# Patient Record
Sex: Male | Born: 1995 | Race: Black or African American | Hispanic: No | Marital: Single | Smoking: Never smoker
Health system: Southern US, Community
[De-identification: ages and names within clinical notes are randomized; demographics above are authoritative.]

## PROBLEM LIST (undated history)

## (undated) ENCOUNTER — Ambulatory Visit (HOSPITAL_COMMUNITY): Admission: EM

---

## 2017-08-21 ENCOUNTER — Encounter (HOSPITAL_COMMUNITY): Payer: Self-pay

## 2017-08-21 ENCOUNTER — Ambulatory Visit (HOSPITAL_COMMUNITY)
Admission: EM | Admit: 2017-08-21 | Discharge: 2017-08-21 | Disposition: A | Discharge status: Home / Self Care | Payer: Self-pay | Attending: Family Medicine | Admitting: Family Medicine

## 2017-08-21 ENCOUNTER — Other Ambulatory Visit: Payer: Self-pay

## 2017-08-21 DIAGNOSIS — J020 Streptococcal pharyngitis: Secondary | ICD-10-CM

## 2017-08-21 LAB — POCT RAPID STREP A: STREPTOCOCCUS, GROUP A SCREEN (DIRECT): POSITIVE — AB

## 2017-08-21 MED ORDER — ONDANSETRON HCL 4 MG PO TABS: 4.0000 mg | ORAL_TABLET | Freq: Three times a day (TID) | ORAL | 0 refills | Status: DC | PRN

## 2017-08-21 MED ORDER — AMOXICILLIN 500 MG PO CAPS: 500.0000 mg | ORAL_CAPSULE | Freq: Two times a day (BID) | ORAL | 0 refills | Status: AC

## 2017-08-21 NOTE — ED Provider Notes (Signed)
[facility]    CSN: [id number] Arrival date & time: 08/21/17  1425     History   Chief Complaint Chief Complaint  Patient presents with  . Sore Throat    HPI Angel Wheeler is a 22 y.o. male.   Angel Wheeler presents with complaints of sore throat, headache, fevers, and vomiting which started 7/28. States he went to a hospital in [place] on 7/30, had a temp of 102, but left without being seen. Was given tylenol at that time which helped with his fever, has been taking it every 4 hours ever since which helps with his fevers. Last at 1p today. Still with sore throat and headache. Drinking fluids but has not been eating. Emesis on 7/29 so has not been eating since. States had two watery bowel movements today. No blood. No abdominal pain. No rash. No known ill contacts. Normal urination. Without contributing medical history.     ROS per HPI.      History reviewed. No pertinent past medical history.  There are no active problems to display for this patient.   History reviewed. No pertinent surgical history.     Home Medications    Prior to Admission medications   Medication Sig Start Date End Date Taking? Authorizing Provider  amoxicillin (AMOXIL) 500 MG capsule Take 1 capsule (500 mg total) by mouth 2 (two) times daily for 10 days. 08/21/17 08/31/17  Georgetta HaberBurky, Damico Partin B, NP  ondansetron (ZOFRAN) 4 MG tablet Take 1 tablet (4 mg total) by mouth every 8 (eight) hours as needed for nausea or vomiting. 08/21/17   Georgetta HaberBurky, Caysen Whang B, NP    Family History No family history on file.  Social History Social History   Tobacco Use  . Smoking status: Never Smoker  . Smokeless tobacco: Never Used  Substance Use Topics  . Alcohol use: Not Currently  . Drug use: Not Currently     Allergies   Patient has no allergy information on record.   Review of Systems Review of Systems   Physical Exam Triage Vital Signs ED Triage Vitals  Enc Vitals Group     BP 08/21/17 1449  125/85     Pulse Rate 08/21/17 1449 83     Resp 08/21/17 1449 16     Temp 08/21/17 1449 97.9 F (36.6 C)     Temp Source 08/21/17 1449 Oral     SpO2 08/21/17 1449 98 %     Weight 08/21/17 1450 187 lb (84.8 kg)     Height --      Head Circumference --      Peak Flow --      Pain Score 08/21/17 1450 6     Pain Loc --      Pain Edu? --      Excl. in GC? --    No data found.  Updated Vital Signs BP 125/85   Pulse 83   Temp 97.9 F (36.6 C) (Oral)   Resp 16   Wt 187 lb (84.8 kg)   SpO2 98%    Physical Exam  Constitutional: He is oriented to person, place, and time. He appears well-developed and well-nourished.  HENT:  Head: Normocephalic and atraumatic.  Right Ear: Tympanic membrane, external ear and ear canal normal.  Left Ear: Tympanic membrane, external ear and ear canal normal.  Nose: Nose normal. Right sinus exhibits no maxillary sinus tenderness and no frontal sinus tenderness. Left sinus exhibits no maxillary sinus tenderness and no frontal sinus tenderness.  Mouth/Throat: Uvula is midline, oropharynx is clear and moist and mucous membranes are normal. No oral lesions. No uvula swelling. No tonsillar abscesses. Tonsils are 2+ on the right. Tonsils are 2+ on the left. No tonsillar exudate.  Eyes: Pupils are equal, round, and reactive to light. Conjunctivae are normal.  Neck: Normal range of motion.  Cardiovascular: Normal rate and regular rhythm.  Pulmonary/Chest: Effort normal and breath sounds normal.  Abdominal: Soft. There is no tenderness.  Lymphadenopathy:    He has cervical adenopathy.  Neurological: He is alert and oriented to person, place, and time.  Skin: Skin is warm and dry.  Vitals reviewed.    UC Treatments / Results  Labs (all labs ordered are listed, but only abnormal results are displayed) Labs Reviewed  POCT RAPID STREP A - Abnormal; Notable for the following components:      Result Value   Streptococcus, Group A Screen (Direct) POSITIVE (*)     All other components within normal limits    EKG None  Radiology No results found.  Procedures Procedures (including critical care time)  Medications Ordered in UC Medications - No data to display  Initial Impression / Assessment and Plan / UC Course  I have reviewed the triage vital signs and the nursing notes.  Pertinent labs & imaging results that were available during my care of the patient were reviewed by me and considered in my medical decision making (see chart for details).     Positive rapid strep. Course of amoxicillin provided. zofran provided. Encouraged fluid intake. Return precautions provided. If symptoms worsen or do not improve in the next week to return to be seen or to follow up with PCP.  Patient verbalized understanding and agreeable to plan.    Final Clinical Impressions(s) / UC Diagnoses   Final diagnoses:  Strep pharyngitis     Discharge Instructions     Complete course of antibiotics.   Push fluids to ensure adequate hydration and keep secretions thin.   Tylenol and/or ibuprofen as needed for pain or fevers.   Zofran as needed for nausea or vomiting.  Considered contagious for 24 hours after antibiotics started.  If symptoms worsen or do not improve in the next week to return to be seen or to follow up with your PCP.      ED Prescriptions    Medication Sig Dispense Auth. Provider   amoxicillin (AMOXIL) 500 MG capsule Take 1 capsule (500 mg total) by mouth 2 (two) times daily for 10 days. 20 capsule Tavius Turgeon B, NP   ondansetron (ZOFRAN) 4 MG tablet Take 1 tablet (4 mg total) by mouth every 8 (eight) hours as needed for nausea or vomiting. 10 tablet Georgetta Haber, NP     Controlled Substance Prescriptions [place] Controlled Substance Registry consulted? Not Applicable   Georgetta Haber, NP 08/21/17 1520

## 2017-08-21 NOTE — ED Triage Notes (Signed)
Sore throat and headache 4 days

## 2017-08-21 NOTE — Discharge Instructions (Addendum)
Complete course of antibiotics.   Push fluids to ensure adequate hydration and keep secretions thin.   Tylenol and/or ibuprofen as needed for pain or fevers.   Zofran as needed for nausea or vomiting.  Considered contagious for 24 hours after antibiotics started.  If symptoms worsen or do not improve in the next week to return to be seen or to follow up with your PCP.

## 2018-05-12 ENCOUNTER — Ambulatory Visit (HOSPITAL_COMMUNITY)
Admission: EM | Admit: 2018-05-12 | Discharge: 2018-05-12 | Disposition: A | Discharge status: Home / Self Care | Payer: Self-pay | Attending: Family Medicine | Admitting: Family Medicine

## 2018-05-12 ENCOUNTER — Encounter (HOSPITAL_COMMUNITY): Payer: Self-pay | Admitting: Emergency Medicine

## 2018-05-12 ENCOUNTER — Other Ambulatory Visit: Payer: Self-pay

## 2018-05-12 DIAGNOSIS — J029 Acute pharyngitis, unspecified: Secondary | ICD-10-CM

## 2018-05-12 DIAGNOSIS — J069 Acute upper respiratory infection, unspecified: Secondary | ICD-10-CM | POA: Insufficient documentation

## 2018-05-12 LAB — POCT RAPID STREP A: Streptococcus, Group A Screen (Direct): NEGATIVE

## 2018-05-12 NOTE — ED Triage Notes (Addendum)
Left ear pain, sore throat, cough, productive cough symptoms started yesterday  Denies fever.

## 2018-05-12 NOTE — ED Provider Notes (Addendum)
[facility]   [id number] 05/12/18 Arrival Time: 1525  ASSESSMENT & PLAN:  1. Sore throat   2. Viral upper respiratory tract infection    See AVS for discharge instructions.  Rapid strep negative. Culture sent. OTC symptom care as needed. Ensure adequate fluid intake and rest. May f/u with PCP or here as needed.  Work note provided.  Reviewed expectations re: course of current medical issues. Questions answered. Outlined signs and symptoms indicating need for more acute intervention. Patient verbalized understanding. After Visit Summary given.   SUBJECTIVE: History from: patient.  Ka-Twan Ma'Leak Chasse is a 23 y.o. male who presents with complaint of nasal congestion, post-nasal drainage, and a mild dry cough; with sore throat. Onset abrupt, yesterday; without fatigue and without body aches. SOB: none. Wheezing: none. Fever: none reported. Overall normal PO intake without n/v. Known sick contacts: no. No specific or significant aggravating or alleviating factors reported. OTC treatment: none reported.  Social History   Tobacco Use  Smoking Status Never Smoker  Smokeless Tobacco Never Used    ROS: As per HPI. All other systems negative.   OBJECTIVE:  Vitals:   05/12/18 1606  BP: 128/87  Pulse: 82  Resp: 18  Temp: 98.7 F (37.1 C)  TempSrc: Oral  SpO2: 97%     General appearance: alert; appears fatigued HEENT: nasal congestion; clear runny nose; throat irritation secondary to post-nasal drainage Neck: supple without LAD CV: RRR Lungs: unlabored respirations, symmetrical air entry without wheezing; cough: absent Abd: soft Ext: no LE edema Skin: warm and dry Psychological: alert and cooperative; normal mood and affect  No Known Allergies  No PMH of recurrent strep throat.  Social History   Socioeconomic History  . Marital status: Single    Spouse name: Not on file  . Number of children: Not on file  . Years of education: Not on file  .  Highest education level: Not on file  Occupational History  . Not on file  Social Needs  . Financial resource strain: Not on file  . Food insecurity:    Worry: Not on file    Inability: Not on file  . Transportation needs:    Medical: Not on file    Non-medical: Not on file  Tobacco Use  . Smoking status: Never Smoker  . Smokeless tobacco: Never Used  Substance and Sexual Activity  . Alcohol use: Not Currently  . Drug use: Not Currently  . Sexual activity: Yes  Lifestyle  . Physical activity:    Days per week: Not on file    Minutes per session: Not on file  . Stress: Not on file  Relationships  . Social connections:    Talks on phone: Not on file    Gets together: Not on file    Attends religious service: Not on file    Active member of club or organization: Not on file    Attends meetings of clubs or organizations: Not on file    Relationship status: Not on file  . Intimate partner violence:    Fear of current or ex partner: Not on file    Emotionally abused: Not on file    Physically abused: Not on file    Forced sexual activity: Not on file  Other Topics Concern  . Not on file  Social History Narrative  . Not on file           Mardella Layman, MD 05/13/18 1118    Mardella Layman, MD 05/13/18 [phone]

## 2018-05-12 NOTE — Discharge Instructions (Signed)
You may use over the counter ibuprofen or acetaminophen as needed.  For a sore throat, over the counter products such as [employer] Peroxyl Mouth Sore Rinse or Chloraseptic Sore Throat Spray may provide some temporary relief. Your rapid strep test was negative today. We have sent your throat swab for culture and will let you know of any positive results. 

## 2018-05-14 LAB — CULTURE, GROUP A STREP (THRC)

## 2018-06-18 ENCOUNTER — Encounter (HOSPITAL_COMMUNITY): Payer: Self-pay

## 2018-06-18 ENCOUNTER — Other Ambulatory Visit: Payer: Self-pay

## 2018-06-18 ENCOUNTER — Ambulatory Visit (HOSPITAL_COMMUNITY)
Admission: EM | Admit: 2018-06-18 | Discharge: 2018-06-18 | Disposition: A | Discharge status: Home / Self Care | Payer: BLUE CROSS/BLUE SHIELD | Attending: Internal Medicine | Admitting: Internal Medicine

## 2018-06-18 DIAGNOSIS — R51 Headache: Secondary | ICD-10-CM

## 2018-06-18 DIAGNOSIS — R519 Headache, unspecified: Secondary | ICD-10-CM

## 2018-06-18 NOTE — ED Provider Notes (Signed)
[facility]    CSN: [id number] Arrival date & time: 06/18/18  1536     History   Chief Complaint Chief Complaint  Patient presents with  . Headache    HPI Ka-Twan Amery Jara is a 23 y.o. male no past medical history comes to urgent care for return to work note.  Patient apparently had some headaches which has subsided.  Patient wanted to go back to work but his employer asked him to come to be evaluated prior to coming back to work.  Patient denies any fever, chills, shortness of breath or cough.  No sick contacts. HPI  History reviewed. No pertinent past medical history.  There are no active problems to display for this patient.   History reviewed. No pertinent surgical history.     Home Medications    Prior to Admission medications   Medication Sig Start Date End Date Taking? Authorizing Provider  ondansetron (ZOFRAN) 4 MG tablet Take 1 tablet (4 mg total) by mouth every 8 (eight) hours as needed for nausea or vomiting. 08/21/17   Georgetta Haber, NP    Family History Family History  Problem Relation Age of Onset  . Hypertension Father     Social History Social History   Tobacco Use  . Smoking status: Never Smoker  . Smokeless tobacco: Never Used  Substance Use Topics  . Alcohol use: Yes  . Drug use: Not Currently     Allergies   Patient has no known allergies.   Review of Systems Review of Systems  Constitutional: Negative for activity change and appetite change.  Neurological: Negative for dizziness, light-headedness, numbness and headaches.     Physical Exam Triage Vital Signs ED Triage Vitals [06/18/18 1601]  Enc Vitals Group     BP (!) 141/79     Pulse Rate 73     Resp 18     Temp 98.6 F (37 C)     Temp Source Oral     SpO2 97 %     Weight 201 lb (91.2 kg)     Height      Head Circumference      Peak Flow      Pain Score 8     Pain Loc      Pain Edu?      Excl. in GC?    No data found.  Updated Vital Signs  BP (!) 141/79   Pulse 73   Temp 98.6 F (37 C) (Oral)   Resp 18   Wt 91.2 kg   SpO2 97%   Visual Acuity Right Eye Distance:   Left Eye Distance:   Bilateral Distance:    Right Eye Near:   Left Eye Near:    Bilateral Near:     Physical Exam Constitutional:      General: He is not in acute distress.    Appearance: He is well-developed. He is not ill-appearing.  Eyes:     General: No visual field deficit. Pulmonary:     Effort: Pulmonary effort is normal. No respiratory distress.     Breath sounds: Normal breath sounds. No wheezing or rales.  Abdominal:     General: Bowel sounds are normal.     Palpations: Abdomen is soft.  Neurological:     Mental Status: He is alert and oriented to person, place, and time.     GCS: GCS eye subscore is 4. GCS verbal subscore is 5. GCS motor subscore is 6.  Cranial Nerves: No cranial nerve deficit, dysarthria or facial asymmetry.      UC Treatments / Results  Labs (all labs ordered are listed, but only abnormal results are displayed) Labs Reviewed - No data to display  EKG None  Radiology No results found.  Procedures Procedures (including critical care time)  Medications Ordered in UC Medications - No data to display  Initial Impression / Assessment and Plan / UC Course  I have reviewed the triage vital signs and the nursing notes.  Pertinent labs & imaging results that were available during my care of the patient were reviewed by me and considered in my medical decision making (see chart for details).     1.  Headache, resolved: Return to work note given to patient. Final Clinical Impressions(s) / UC Diagnoses   Final diagnoses:  Headache disorder   Discharge Instructions   None    ED Prescriptions    None     Controlled Substance Prescriptions [place] Controlled Substance Registry consulted? No   Merrilee JanskyLamptey, Bona Hubbard O, MD 06/18/18 2006

## 2018-06-18 NOTE — ED Triage Notes (Signed)
Pt states he has a headache x 2 days. Pt needs a work note.

## 2018-08-09 ENCOUNTER — Other Ambulatory Visit: Payer: Self-pay

## 2018-08-09 ENCOUNTER — Ambulatory Visit (HOSPITAL_COMMUNITY)
Admission: EM | Admit: 2018-08-09 | Discharge: 2018-08-09 | Disposition: A | Discharge status: Home / Self Care | Payer: BLUE CROSS/BLUE SHIELD | Attending: Urgent Care | Admitting: Urgent Care

## 2018-08-09 ENCOUNTER — Encounter (HOSPITAL_COMMUNITY): Payer: Self-pay | Admitting: Urgent Care

## 2018-08-09 DIAGNOSIS — R5381 Other malaise: Secondary | ICD-10-CM

## 2018-08-09 DIAGNOSIS — J029 Acute pharyngitis, unspecified: Secondary | ICD-10-CM

## 2018-08-09 LAB — POCT RAPID STREP A: Streptococcus, Group A Screen (Direct): NEGATIVE

## 2018-08-09 MED ORDER — CLINDAMYCIN HCL 300 MG PO CAPS: 300.0000 mg | ORAL_CAPSULE | Freq: Three times a day (TID) | ORAL | 0 refills | Status: DC

## 2018-08-09 MED ORDER — NAPROXEN 500 MG PO TABS: 500.0000 mg | ORAL_TABLET | Freq: Two times a day (BID) | ORAL | 0 refills | Status: DC

## 2018-08-09 NOTE — ED Triage Notes (Signed)
Pt sts sore throat x 3 days  

## 2018-08-09 NOTE — Discharge Instructions (Signed)
For sore throat or cough try using a honey-based tea. Use 3 teaspoons of honey with juice squeezed from half lemon. Place shaved pieces of ginger into 1/2-1 cup of water and warm over stove top. Then mix the ingredients and repeat every 4 hours as needed. Please take naproxen 500 mg every 12 hours with food for your throat pain and inflammation.  Hydrate very well with at least 2 liters of water. Eat light meals such as soups to replenish electrolytes and soft fruits, veggies. Start an antihistamine like Zyrtec, Allegra or Claritin for postnasal drainage, sinus congestion.  You can take this together with pseudoephedrine (Sudafed) at a dose of 60 mg 3 times a day oral 120 mg twice daily as needed for the same kind of congestion.  However, do not take Sudafed if you have high blood pressure or are prone to palpitations, have abnormal heart rhythms.

## 2018-08-09 NOTE — ED Provider Notes (Signed)
MRN: [id number] DOB: Dec 08, 1995  Subjective:   Angel Wheeler is a 23 y.o. male presenting for 3 day history of acute onset worsening moderate-severe throat pain with difficulty swallowing and speaking. Has tried ibuprofen with some relief. He is not currently taking any medications and has no known food or drug allergies.  Denies past medical and surgical history. Denies smoking cigarettes. No COVID contacts.    Review of Systems  Constitutional: Positive for fever (subjective) and malaise/fatigue.  HENT: Positive for ear pain and sore throat. Negative for congestion and sinus pain.   Eyes: Negative for blurred vision, double vision, discharge and redness.  Respiratory: Negative for cough, hemoptysis, shortness of breath and wheezing.   Cardiovascular: Negative for chest pain.  Gastrointestinal: Positive for vomiting (1 episode last night). Negative for abdominal pain, diarrhea and nausea.  Genitourinary: Negative for dysuria, flank pain and hematuria.  Musculoskeletal: Negative for myalgias.  Skin: Negative for rash.  Neurological: Negative for dizziness, weakness and headaches.  Psychiatric/Behavioral: Negative for depression and substance abuse.    Objective:   Vitals: BP 130/86 (BP Location: Right Arm)   Pulse 72   Temp 98.6 F (37 C) (Oral)   Resp 16   SpO2 99%   Physical Exam Constitutional:      General: He is not in acute distress.    Appearance: Normal appearance. He is well-developed and normal weight. He is not ill-appearing, toxic-appearing or diaphoretic.  HENT:     Head: Normocephalic and atraumatic.     Right Ear: Tympanic membrane, ear canal and external ear normal. There is no impacted cerumen.     Left Ear: Tympanic membrane, ear canal and external ear normal. There is no impacted cerumen.     Nose: Nose normal. No congestion or rhinorrhea.     Mouth/Throat:     Mouth: Mucous membranes are moist.     Pharynx: Pharyngeal swelling (2+ with deviation  medially) and posterior oropharyngeal erythema present. No oropharyngeal exudate.     Comments: Patient has muffled voice and difficulty speaking. Eyes:     General: No scleral icterus.       Right eye: No discharge.        Left eye: No discharge.     Extraocular Movements: Extraocular movements intact.     Conjunctiva/sclera: Conjunctivae normal.     Pupils: Pupils are equal, round, and reactive to light.  Neck:     Musculoskeletal: Normal range of motion and neck supple. No neck rigidity or muscular tenderness.  Cardiovascular:     Rate and Rhythm: Normal rate and regular rhythm.     Heart sounds: Normal heart sounds. No murmur. No friction rub. No gallop.   Pulmonary:     Effort: Pulmonary effort is normal. No respiratory distress.     Breath sounds: Normal breath sounds. No stridor. No wheezing, rhonchi or rales.  Neurological:     General: No focal deficit present.     Mental Status: He is alert and oriented to person, place, and time.  Psychiatric:        Mood and Affect: Mood normal.        Behavior: Behavior normal.        Thought Content: Thought content normal.    Results for orders placed or performed during the hospital encounter of 08/09/18 (from the past 24 hour(s))  POCT rapid strep A [facility])     Status: None   Collection Time: 08/09/18 11:02 AM  Result Value Ref Range  Streptococcus, Group A Screen (Direct) NEGATIVE NEGATIVE    Assessment and Plan :   1. Acute pharyngitis, unspecified etiology   2. Sore throat   3. Malaise     Patient has concerning physical exam findings and despite negative strep will cover for pharyngitis with clindamycin.  Counseled patient if he starts to have difficulty with his breathing that he needs to report to the emergency room.  Also if he continues to have symptoms despite our treatment will have patient report to the emergency room for retropharyngeal abscess. Counseled patient on potential for adverse effects with  medications prescribed/recommended today, ER and return-to-clinic precautions discussed, patient verbalized understanding.    Wallis BambergMani, Raylynn Hersh, PA-C 08/09/18 1106

## 2018-08-11 LAB — CULTURE, GROUP A STREP (THRC)

## 2018-08-19 ENCOUNTER — Encounter (HOSPITAL_COMMUNITY): Payer: Self-pay | Admitting: Emergency Medicine

## 2018-08-19 ENCOUNTER — Other Ambulatory Visit: Payer: Self-pay

## 2018-08-19 ENCOUNTER — Ambulatory Visit (HOSPITAL_COMMUNITY)
Admission: EM | Admit: 2018-08-19 | Discharge: 2018-08-19 | Disposition: A | Discharge status: Home / Self Care | Payer: Self-pay | Attending: Emergency Medicine | Admitting: Emergency Medicine

## 2018-08-19 DIAGNOSIS — Z7251 High risk heterosexual behavior: Secondary | ICD-10-CM

## 2018-08-19 DIAGNOSIS — R300 Dysuria: Secondary | ICD-10-CM

## 2018-08-19 DIAGNOSIS — Z202 Contact with and (suspected) exposure to infections with a predominantly sexual mode of transmission: Secondary | ICD-10-CM

## 2018-08-19 LAB — POCT URINALYSIS DIP (DEVICE)
Bilirubin Urine: NEGATIVE
Glucose, UA: NEGATIVE mg/dL
Ketones, ur: NEGATIVE mg/dL
Leukocytes,Ua: NEGATIVE
Nitrite: NEGATIVE
Protein, ur: NEGATIVE mg/dL
Specific Gravity, Urine: >=1.03 (ref 1.005–1.030)
Urobilinogen, UA: 0.2 mg/dL (ref 0.0–1.0)
pH: 6 (ref 5.0–8.0)

## 2018-08-19 MED ORDER — AZITHROMYCIN 250 MG PO TABS
ORAL_TABLET | ORAL | Status: AC
  Filled 2018-08-19: qty 4

## 2018-08-19 MED ORDER — METRONIDAZOLE 500 MG PO TABS: 500.0000 mg | ORAL_TABLET | Freq: Two times a day (BID) | ORAL | 0 refills | Status: DC

## 2018-08-19 MED ORDER — AZITHROMYCIN 250 MG PO TABS
1000.0000 mg | ORAL_TABLET | Freq: Once | ORAL | Status: AC
  Administered 2018-08-19: 1000 mg via ORAL

## 2018-08-19 MED ORDER — CEFTRIAXONE SODIUM 250 MG IJ SOLR
INTRAMUSCULAR | Status: AC
  Filled 2018-08-19: qty 250

## 2018-08-19 MED ORDER — CEFTRIAXONE SODIUM 250 MG IJ SOLR
250.0000 mg | Freq: Once | INTRAMUSCULAR | Status: AC
  Administered 2018-08-19: 250 mg via INTRAMUSCULAR

## 2018-08-19 NOTE — ED Triage Notes (Signed)
Dysuria for 3 weeks, requests STD testing

## 2018-08-19 NOTE — ED Provider Notes (Signed)
  MRN: [id number] DOB: February 23, 1995  Subjective:   Angel Wheeler is a 23 y.o. male presenting for STI testing.  Patient reports that he has had at least 2 weeks of painful urination.  Has multiple male sex partners and does not always use condoms for protection.  He states that his girlfriend recently got tested and was positive for what he thinks is chlamydia and trichomonas.  No current medications.  No Known Allergies  No past medical history or past surgical history.  ROS Denies hematuria, urinary frequency, penile discharge, penile swelling, testicular pain, testicular swelling, anal pain, groin pain.   Objective:   Vitals: BP 113/75 (BP Location: Left Arm)   Pulse (!) 58   Temp 97.8 F (36.6 C) (Temporal)   Resp 16   SpO2 98%   Physical Exam Constitutional:      Appearance: Normal appearance. He is well-developed and normal weight.  HENT:     Head: Normocephalic and atraumatic.     Right Ear: External ear normal.     Left Ear: External ear normal.     Nose: Nose normal.     Mouth/Throat:     Pharynx: Oropharynx is clear.  Eyes:     Extraocular Movements: Extraocular movements intact.     Pupils: Pupils are equal, round, and reactive to light.  Cardiovascular:     Rate and Rhythm: Normal rate.  Pulmonary:     Effort: Pulmonary effort is normal.  Neurological:     Mental Status: He is alert and oriented to person, place, and time.  Psychiatric:        Mood and Affect: Mood normal.        Behavior: Behavior normal.      Assessment and Plan :   1. Dysuria   2. Exposure to STD   3. Exposure to chlamydia   4. Exposure to trichomonas     We will treat empirically per CDC guidelines for gonorrhea and chlamydia with IM ceftriaxone and azithromycin in clinic.  Given exposure to trichomonas will also cover for this with Flagyl.  Counseled on safe sex practices including abstaining for 1 week following treatment.  Counseled patient on potential for adverse  effects with medications prescribed/recommended today, ER and return-to-clinic precautions discussed, patient verbalized understanding.    Jaynee Eagles, [place] 08/19/18 1133

## 2018-08-20 LAB — URINE CYTOLOGY ANCILLARY ONLY
Chlamydia: NEGATIVE
Neisseria Gonorrhea: NEGATIVE
Trichomonas: NEGATIVE

## 2018-09-24 ENCOUNTER — Encounter (HOSPITAL_COMMUNITY): Payer: Self-pay

## 2018-09-24 ENCOUNTER — Other Ambulatory Visit: Payer: Self-pay

## 2018-09-24 ENCOUNTER — Ambulatory Visit (HOSPITAL_COMMUNITY)
Admission: EM | Admit: 2018-09-24 | Discharge: 2018-09-24 | Disposition: A | Discharge status: Home / Self Care | Payer: Self-pay | Attending: Internal Medicine | Admitting: Internal Medicine

## 2018-09-24 DIAGNOSIS — J029 Acute pharyngitis, unspecified: Secondary | ICD-10-CM | POA: Insufficient documentation

## 2018-09-24 DIAGNOSIS — Z20828 Contact with and (suspected) exposure to other viral communicable diseases: Secondary | ICD-10-CM | POA: Insufficient documentation

## 2018-09-24 LAB — POCT RAPID STREP A: Streptococcus, Group A Screen (Direct): NEGATIVE

## 2018-09-24 MED ORDER — ACETAMINOPHEN 325 MG PO TABS: 650.0000 mg | ORAL_TABLET | Freq: Once | ORAL | Status: DC

## 2018-09-24 MED ORDER — IBUPROFEN 400 MG PO TABS: 400.0000 mg | ORAL_TABLET | Freq: Four times a day (QID) | ORAL | 0 refills | Status: DC | PRN

## 2018-09-24 MED ORDER — ACETAMINOPHEN 325 MG PO TABS
ORAL_TABLET | ORAL | Status: AC
  Filled 2018-09-24: qty 2

## 2018-09-24 MED ORDER — CEPACOL SORE THROAT 10-2.1 MG MT LOZG: 1.0000 | LOZENGE | OROMUCOSAL | 0 refills | Status: DC | PRN

## 2018-09-24 NOTE — ED Provider Notes (Signed)
[facility]    CSN: [id number] Arrival date & time: 09/24/18  [id number]      History   Chief Complaint No chief complaint on file.   HPI Angel Wheeler is a 23 y.o. male with no past medical history comes to urgent care with complaint of sore throat, fever, chills and headache of 2 days duration.  Symptom onset was abrupt.  Sore throat is severe.  Associated with difficulty swallowing.  No known relieving factors.  Patient has been trying some Advil at home with no improvement.  No nausea/vomiting or diarrhea.  No rash in the skin.  Patient denies any sick contacts.  He has some body aches which is minimal.  No shortness of breath, cough or sputum production.Marland Kitchen.   HPI  No past medical history on file.  There are no active problems to display for this patient.   No past surgical history on file.     Home Medications    Prior to Admission medications   Medication Sig Start Date End Date Taking? Authorizing Provider  clindamycin (CLEOCIN) 300 MG capsule Take 1 capsule (300 mg total) by mouth 3 (three) times daily. 08/09/18   Wallis BambergMani, Mario, PA-C  metroNIDAZOLE (FLAGYL) 500 MG tablet Take 1 tablet (500 mg total) by mouth 2 (two) times daily with a meal. DO NOT CONSUME ALCOHOL WHILE TAKING THIS MEDICATION. 08/19/18   Wallis BambergMani, Mario, PA-C  naproxen (NAPROSYN) 500 MG tablet Take 1 tablet (500 mg total) by mouth 2 (two) times daily. 08/09/18   Wallis BambergMani, Mario, PA-C    Family History Family History  Problem Relation Age of Onset  . Hypertension Father     Social History Social History   Tobacco Use  . Smoking status: Never Smoker  . Smokeless tobacco: Never Used  Substance Use Topics  . Alcohol use: Yes  . Drug use: Not Currently     Allergies   Patient has no known allergies.   Review of Systems Review of Systems  Constitutional: Positive for activity change, chills, fatigue and fever.  HENT: Positive for congestion and sore throat. Negative for ear pain, hearing  loss and rhinorrhea.   Respiratory: Negative.   Gastrointestinal: Negative.   Genitourinary: Negative.   Neurological: Negative for dizziness, weakness and light-headedness.  Psychiatric/Behavioral: Negative.      Physical Exam Triage Vital Signs ED Triage Vitals  Enc Vitals Group     BP      Pulse      Resp      Temp      Temp src      SpO2      Weight      Height      Head Circumference      Peak Flow      Pain Score      Pain Loc      Pain Edu?      Excl. in [place]?    No data found.  Updated Vital Signs There were no vitals taken for this visit.  Visual Acuity Right Eye Distance:   Left Eye Distance:   Bilateral Distance:    Right Eye Near:   Left Eye Near:    Bilateral Near:     Physical Exam Constitutional:      Appearance: He is ill-appearing. He is not toxic-appearing.  HENT:     Right Ear: Tympanic membrane normal. No drainage or swelling.     Left Ear: Tympanic membrane normal. No drainage or swelling.  Mouth/Throat:     Mouth: Mucous membranes are moist.     Tonsils: Tonsillar exudate present. 1+ on the right. 1+ on the left.  Eyes:     Conjunctiva/sclera: Conjunctivae normal.  Neck:     Musculoskeletal: Normal range of motion.  Cardiovascular:     Rate and Rhythm: Normal rate and regular rhythm.  Pulmonary:     Effort: Pulmonary effort is normal.  Abdominal:     Palpations: Abdomen is soft.  Skin:    General: Skin is warm.     Capillary Refill: Capillary refill takes less than 2 seconds.  Neurological:     Mental Status: He is alert.      UC Treatments / Results  Labs (all labs ordered are listed, but only abnormal results are displayed) Labs Reviewed - No data to display  EKG   Radiology No results found.  Procedures Procedures (including critical care time)  Medications Ordered in UC Medications - No data to display  Initial Impression / Assessment and Plan / UC Course  I have reviewed the triage vital signs and the  nursing notes.  Pertinent labs & imaging results that were available during my care of the patient were reviewed by me and considered in my medical decision making (see chart for details).     1.  Flulike illness: Strep throat is negative Throat cultures have been sent COVID-19 test has been sent Patient is advised to self quarantine until the COVID-19 test is available. If patient's condition gets worse he is advised to return to urgent care to be reevaluated. Final Clinical Impressions(s) / UC Diagnoses   Final diagnoses:  None   Discharge Instructions   None    ED Prescriptions    None     Controlled Substance Prescriptions [place] Controlled Substance Registry consulted? No   Chase Picket, MD 09/24/18 1159

## 2018-09-24 NOTE — ED Triage Notes (Signed)
Pt states he has a sore throat and vomiting. X 2 days

## 2018-09-26 LAB — CULTURE, GROUP A STREP (THRC)

## 2018-09-26 LAB — NOVEL CORONAVIRUS, NAA (HOSP ORDER, SEND-OUT TO REF LAB; TAT 18-24 HRS): SARS-CoV-2, NAA: NOT DETECTED

## 2019-01-27 ENCOUNTER — Ambulatory Visit (HOSPITAL_COMMUNITY): Admission: EM | Admit: 2019-01-27 | Discharge: 2019-01-27 | Discharge status: Against Medical Advice | Payer: Self-pay

## 2019-01-28 ENCOUNTER — Other Ambulatory Visit: Payer: Self-pay

## 2019-01-28 ENCOUNTER — Ambulatory Visit (HOSPITAL_COMMUNITY)
Admission: EM | Admit: 2019-01-28 | Discharge: 2019-01-28 | Disposition: A | Discharge status: Home / Self Care | Payer: Self-pay | Attending: Family Medicine | Admitting: Family Medicine

## 2019-01-28 ENCOUNTER — Encounter (HOSPITAL_COMMUNITY): Payer: Self-pay

## 2019-01-28 DIAGNOSIS — R369 Urethral discharge, unspecified: Secondary | ICD-10-CM

## 2019-01-28 MED ORDER — CEFTRIAXONE SODIUM 250 MG IJ SOLR
INTRAMUSCULAR | Status: AC
  Filled 2019-01-28: qty 500

## 2019-01-28 MED ORDER — LIDOCAINE HCL (PF) 1 % IJ SOLN
INTRAMUSCULAR | Status: AC
Start: 2019-01-28 — End: ?
  Filled 2019-01-28: qty 2

## 2019-01-28 MED ORDER — CEFTRIAXONE SODIUM 1 G IJ SOLR
0.5000 g | Freq: Once | INTRAMUSCULAR | Status: AC
  Administered 2019-01-28: 10:00:00 0.5 g via INTRAMUSCULAR

## 2019-01-28 MED ORDER — DOXYCYCLINE HYCLATE 100 MG PO CAPS: 100.0000 mg | ORAL_CAPSULE | Freq: Two times a day (BID) | ORAL | 0 refills | Status: AC

## 2019-01-28 NOTE — ED Triage Notes (Signed)
Pt states he has penile discharge. Pt states he needs to be treated for STD's. Pt states he has had this discharge for a week.

## 2019-01-28 NOTE — ED Provider Notes (Signed)
[facility]    CSN: [id number] Arrival date & time: 01/28/19  [id number]      History   Chief Complaint Chief Complaint  Patient presents with  . SEXUALLY TRANSMITTED DISEASE  . Penile Discharge    HPI Angel Wheeler is a 24 y.o. male.   Patient is a 24 year old male who presents today with approximately 1 week of dysuria and penile discharge.  Symptoms have been constant.  Currently sexually active with 1 or more partners unprotected.  Denies any rashes, penile pain, testicle pain or swelling.  Denies any abdominal pain, fever, chills.  ROS per HPI      History reviewed. No pertinent past medical history.  There are no problems to display for this patient.   History reviewed. No pertinent surgical history.     Home Medications    Prior to Admission medications   Medication Sig Start Date End Date Taking? Authorizing Provider  Benzocaine-Menthol (CEPACOL SORE THROAT) 10-2.1 MG LOZG Use as directed 1 lozenge in the mouth or throat as needed. 09/24/18   Lamptey, Myrene Galas, MD  doxycycline (VIBRAMYCIN) 100 MG capsule Take 1 capsule (100 mg total) by mouth 2 (two) times daily for 7 days. 01/28/19 02/04/19  Loura Halt A, NP  ibuprofen (ADVIL) 400 MG tablet Take 1 tablet (400 mg total) by mouth every 6 (six) hours as needed. 09/24/18   LampteyMyrene Galas, MD    Family History Family History  Problem Relation Age of Onset  . Hypertension Father     Social History Social History   Tobacco Use  . Smoking status: Never Smoker  . Smokeless tobacco: Never Used  Substance Use Topics  . Alcohol use: Yes  . Drug use: Not Currently     Allergies   Patient has no known allergies.   Review of Systems Review of Systems   Physical Exam Triage Vital Signs ED Triage Vitals [01/28/19 0847]  Enc Vitals Group     BP 118/83     Pulse Rate 66     Resp 18     Temp 98 F (36.7 C)     Temp Source Oral     SpO2 98 %     Weight 200 lb (90.7 kg)     Height     Head Circumference      Peak Flow      Pain Score 5     Pain Loc      Pain Edu?      Excl. in [place]?    No data found.  Updated Vital Signs BP 118/83 (BP Location: Right Arm)   Pulse 66   Temp 98 F (36.7 C) (Oral)   Resp 18   Wt 200 lb (90.7 kg)   SpO2 98%   Visual Acuity Right Eye Distance:   Left Eye Distance:   Bilateral Distance:    Right Eye Near:   Left Eye Near:    Bilateral Near:     Physical Exam Vitals and nursing note reviewed.  Constitutional:      Appearance: Normal appearance.  HENT:     Head: Normocephalic and atraumatic.     Nose: Nose normal.  Eyes:     Conjunctiva/sclera: Conjunctivae normal.  Pulmonary:     Effort: Pulmonary effort is normal.  Abdominal:     Palpations: Abdomen is soft.     Tenderness: There is no abdominal tenderness.  Genitourinary:    Testes: Normal.     Comments: White discharge  from meatus Musculoskeletal:        General: Normal range of motion.     Cervical back: Normal range of motion.  Skin:    General: Skin is warm and dry.  Neurological:     Mental Status: He is alert.  Psychiatric:        Mood and Affect: Mood normal.      UC Treatments / Results  Labs (all labs ordered are listed, but only abnormal results are displayed) Labs Reviewed  CYTOLOGY, (ORAL, ANAL, URETHRAL) ANCILLARY ONLY    EKG   Radiology No results found.  Procedures Procedures (including critical care time)  Medications Ordered in UC Medications  cefTRIAXone (ROCEPHIN) injection 0.5 g (0.5 g Intramuscular Given 01/28/19 0931)    Initial Impression / Assessment and Plan / UC Course  I have reviewed the triage vital signs and the nursing notes.  Pertinent labs & imaging results that were available during my care of the patient were reviewed by me and considered in my medical decision making (see chart for details).     Penile discharge-treating with 500 Rocephin here in clinic and sending home with doxycycline Recommended  safe sex practices Follow up as needed for continued or worsening symptoms  Final Clinical Impressions(s) / UC Diagnoses   Final diagnoses:  Penile discharge     Discharge Instructions     Treating you for gonorrhea and chlamydia. Injection given here in clinic and prescription sent to the pharmacy to take for the next week. Make sure you finish the complete prescription     ED Prescriptions    Medication Sig Dispense Auth. Provider   doxycycline (VIBRAMYCIN) 100 MG capsule Take 1 capsule (100 mg total) by mouth 2 (two) times daily for 7 days. 14 capsule Kalika Smay A, NP     PDMP not reviewed this encounter.   Dahlia Byes A, NP 01/28/19 1411

## 2019-01-28 NOTE — Discharge Instructions (Addendum)
Treating you for gonorrhea and chlamydia. Injection given here in clinic and prescription sent to the pharmacy to take for the next week. Make sure you finish the complete prescription

## 2019-01-29 LAB — CYTOLOGY, (ORAL, ANAL, URETHRAL) ANCILLARY ONLY
Chlamydia: NEGATIVE
Neisseria Gonorrhea: POSITIVE — AB
Trichomonas: NEGATIVE

## 2019-01-30 ENCOUNTER — Telehealth (HOSPITAL_COMMUNITY): Payer: Self-pay | Admitting: Emergency Medicine

## 2019-01-30 NOTE — Telephone Encounter (Signed)
Test for gonorrhea was positive. This was treated at the urgent care visit with IM rocephin 500mg . Please refrain from sexual intercourse for 7 days after treatment to give the medicine time to work. Sexual partners need to be notified and tested/treated. Condoms may reduce risk of reinfection. Recheck or followup with PCP for further evaluation if symptoms are not improving. [facility] notified.   Patient contacted by phone and made aware of    results. Pt verbalized understanding and had all questions answered.

## 2019-11-27 ENCOUNTER — Encounter (HOSPITAL_COMMUNITY): Payer: Self-pay

## 2019-11-27 ENCOUNTER — Emergency Department (HOSPITAL_COMMUNITY): Payer: Self-pay

## 2019-11-27 ENCOUNTER — Emergency Department (HOSPITAL_COMMUNITY)
Admission: EM | Admit: 2019-11-27 | Discharge: 2019-11-28 | Disposition: A | Discharge status: Home / Self Care | Payer: Self-pay | Attending: Emergency Medicine | Admitting: Emergency Medicine

## 2019-11-27 DIAGNOSIS — R52 Pain, unspecified: Secondary | ICD-10-CM

## 2019-11-27 DIAGNOSIS — Z5321 Procedure and treatment not carried out due to patient leaving prior to being seen by health care provider: Secondary | ICD-10-CM | POA: Insufficient documentation

## 2019-11-27 DIAGNOSIS — R109 Unspecified abdominal pain: Secondary | ICD-10-CM | POA: Insufficient documentation

## 2019-11-27 DIAGNOSIS — R519 Headache, unspecified: Secondary | ICD-10-CM | POA: Diagnosis present

## 2019-11-27 DIAGNOSIS — Y92410 Unspecified street and highway as the place of occurrence of the external cause: Secondary | ICD-10-CM | POA: Insufficient documentation

## 2019-11-27 DIAGNOSIS — M25552 Pain in left hip: Secondary | ICD-10-CM | POA: Insufficient documentation

## 2019-11-27 DIAGNOSIS — M542 Cervicalgia: Secondary | ICD-10-CM | POA: Insufficient documentation
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
## 2019-11-27 NOTE — ED Notes (Signed)
Pt yelling in lobby cursing about the wait. Staff asked pt multiple times to stop cursing, but pt refused. Staff informed pt that he can step outside if he feels the need to continue. Pt decided to leave.

## 2019-11-28 ENCOUNTER — Emergency Department (HOSPITAL_COMMUNITY)
Admission: EM | Admit: 2019-11-28 | Discharge: 2019-11-28 | Disposition: A | Discharge status: Home / Self Care | Payer: Self-pay | Source: Home / Self Care | Attending: Emergency Medicine | Admitting: Emergency Medicine

## 2019-11-28 ENCOUNTER — Encounter (HOSPITAL_COMMUNITY): Payer: Self-pay | Admitting: Emergency Medicine

## 2019-11-28 ENCOUNTER — Other Ambulatory Visit: Payer: Self-pay

## 2019-11-28 ENCOUNTER — Emergency Department (HOSPITAL_COMMUNITY): Payer: Self-pay

## 2019-11-28 ENCOUNTER — Ambulatory Visit (HOSPITAL_COMMUNITY)
Admission: EM | Admit: 2019-11-28 | Discharge: 2019-11-28 | Disposition: A | Discharge status: Other Acute Inpatient Hospital | Payer: Self-pay

## 2019-11-28 LAB — CBC WITH DIFFERENTIAL/PLATELET
Abs Immature Granulocytes: 0.02 10*3/uL (ref 0.00–0.07)
Basophils Absolute: 0 10*3/uL (ref 0.0–0.1)
Basophils Relative: 0 %
Eosinophils Absolute: 0 10*3/uL (ref 0.0–0.5)
Eosinophils Relative: 0 %
HCT: 53.1 % — ABNORMAL HIGH (ref 39.0–52.0)
Hemoglobin: 17.7 g/dL — ABNORMAL HIGH (ref 13.0–17.0)
Immature Granulocytes: 0 %
Lymphocytes Relative: 22 %
Lymphs Abs: 1.6 10*3/uL (ref 0.7–4.0)
MCH: 31.2 pg (ref 26.0–34.0)
MCHC: 33.3 g/dL (ref 30.0–36.0)
MCV: 93.5 fL (ref 80.0–100.0)
Monocytes Absolute: 0.5 10*3/uL (ref 0.1–1.0)
Monocytes Relative: 7 %
Neutro Abs: 5.3 10*3/uL (ref 1.7–7.7)
Neutrophils Relative %: 71 %
Platelets: 272 10*3/uL (ref 150–400)
RBC: 5.68 MIL/uL (ref 4.22–5.81)
RDW: 13.2 % (ref 11.5–15.5)
WBC: 7.5 10*3/uL (ref 4.0–10.5)
nRBC: 0 % (ref 0.0–0.2)

## 2019-11-28 LAB — URINALYSIS, ROUTINE W REFLEX MICROSCOPIC
Bilirubin Urine: NEGATIVE
Glucose, UA: NEGATIVE mg/dL
Hgb urine dipstick: NEGATIVE
Ketones, ur: NEGATIVE mg/dL
Leukocytes,Ua: NEGATIVE
Nitrite: NEGATIVE
Protein, ur: NEGATIVE mg/dL
Specific Gravity, Urine: 1.021 (ref 1.005–1.030)
pH: 6 (ref 5.0–8.0)

## 2019-11-28 LAB — COMPREHENSIVE METABOLIC PANEL
ALT: 27 U/L (ref 0–44)
AST: 33 U/L (ref 15–41)
Albumin: 4.3 g/dL (ref 3.5–5.0)
Alkaline Phosphatase: 52 U/L (ref 38–126)
Anion gap: 10 (ref 5–15)
BUN: 13 mg/dL (ref 6–20)
CO2: 26 mmol/L (ref 22–32)
Calcium: 10 mg/dL (ref 8.9–10.3)
Chloride: 99 mmol/L (ref 98–111)
Creatinine, Ser: 1.25 mg/dL — ABNORMAL HIGH (ref 0.61–1.24)
GFR, Estimated: >60 mL/min (ref >60)
Glucose, Bld: 92 mg/dL (ref 70–99)
Potassium: 3.9 mmol/L (ref 3.5–5.1)
Sodium: 135 mmol/L (ref 135–145)
Total Bilirubin: 0.9 mg/dL (ref 0.3–1.2)
Total Protein: 8.1 g/dL (ref 6.5–8.1)

## 2019-11-28 LAB — LIPASE, BLOOD: Lipase: 28 U/L (ref 11–51)

## 2019-11-28 MED ORDER — MORPHINE SULFATE (PF) 4 MG/ML IV SOLN
8.0000 mg | Freq: Once | INTRAVENOUS | Status: AC
  Administered 2019-11-28: 8 mg via INTRAVENOUS
  Filled 2019-11-28: qty 2

## 2019-11-28 MED ORDER — ONDANSETRON HCL 4 MG/2ML IJ SOLN
4.0000 mg | Freq: Once | INTRAMUSCULAR | Status: AC
  Administered 2019-11-28: 4 mg via INTRAVENOUS
  Filled 2019-11-28: qty 2

## 2019-11-28 MED ORDER — DICLOFENAC SODIUM 1 % EX GEL: 4.0000 g | Freq: Four times a day (QID) | CUTANEOUS | 0 refills | Status: DC

## 2019-11-28 MED ORDER — IOHEXOL 300 MG/ML  SOLN
100.0000 mL | Freq: Once | INTRAMUSCULAR | Status: AC | PRN
  Administered 2019-11-28: 100 mL via INTRAVENOUS

## 2019-11-28 NOTE — ED Notes (Signed)
Patient transported to CT 
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
## 2019-11-28 NOTE — ED Notes (Signed)
Transported to CT 

## 2019-11-28 NOTE — Discharge Instructions (Signed)
Take 4 over the counter ibuprofen tablets 3 times a day or 2 over-the-counter naproxen tablets twice a day for pain. Also take tylenol 1000mg (2 extra strength) four times a day.    Luckily all of your imaging was negative for acute intra-abdominal or bony injury.  Please follow-up with your family doctor.  Return to the ED for worsening pain.
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
## 2020-04-18 ENCOUNTER — Ambulatory Visit (HOSPITAL_COMMUNITY): Admission: EM | Admit: 2020-04-18 | Discharge: 2020-04-18 | Discharge status: Against Medical Advice | Payer: Self-pay

## 2020-04-18 ENCOUNTER — Other Ambulatory Visit: Payer: Self-pay

## 2020-08-04 ENCOUNTER — Ambulatory Visit (INDEPENDENT_AMBULATORY_CARE_PROVIDER_SITE_OTHER): Payer: Self-pay

## 2020-08-04 ENCOUNTER — Other Ambulatory Visit: Payer: Self-pay

## 2020-08-04 ENCOUNTER — Ambulatory Visit (HOSPITAL_COMMUNITY)
Admission: EM | Admit: 2020-08-04 | Discharge: 2020-08-04 | Disposition: A | Discharge status: Home / Self Care | Payer: Self-pay | Attending: Medical Oncology | Admitting: Medical Oncology

## 2020-08-04 ENCOUNTER — Encounter (HOSPITAL_COMMUNITY): Payer: Self-pay | Admitting: Emergency Medicine

## 2020-08-04 DIAGNOSIS — R059 Cough, unspecified: Secondary | ICD-10-CM

## 2020-08-04 DIAGNOSIS — J029 Acute pharyngitis, unspecified: Secondary | ICD-10-CM | POA: Insufficient documentation

## 2020-08-04 DIAGNOSIS — R042 Hemoptysis: Secondary | ICD-10-CM

## 2020-08-04 LAB — POCT INFECTIOUS MONO SCREEN, ED / UC: Mono Screen: NEGATIVE

## 2020-08-04 LAB — POCT RAPID STREP A, ED / UC: Streptococcus, Group A Screen (Direct): NEGATIVE

## 2020-08-04 MED ORDER — OMEPRAZOLE 20 MG PO CPDR: 20.0000 mg | DELAYED_RELEASE_CAPSULE | Freq: Every day | ORAL | 0 refills | Status: DC

## 2020-08-04 MED ORDER — BENZONATATE 100 MG PO CAPS: 100.0000 mg | ORAL_CAPSULE | Freq: Three times a day (TID) | ORAL | 0 refills | Status: DC

## 2020-08-04 NOTE — ED Triage Notes (Signed)
Coughing up yellow and blood tinged sputum. Denies fever, weight loss, night sweats.

## 2020-08-04 NOTE — ED Provider Notes (Signed)
[facility]    CSN: [id number] Arrival date & time: 08/04/20  4818      History   Chief Complaint Chief Complaint  Patient presents with   Cough    HPI Angel Wheeler is a 25 y.o. male.   HPI  Cough: Patient states that he has had a cough for about 1 month.  He states that he is coughing up yellow and red sputum.  He states that cough is worse at night when he sleeps and first thing in the morning.  He has not tried anything for cough.  He denies any unintentional weight loss, night sweats, known interactions with individuals with active TB, high risk TB exposures.  He denies any chest pain, shortness of breath or wheezing.  No known fevers.  He does also report a fairly significant sore throat that he has had for about a month.  Sore throat rated ache like an 7 out of 10 in nature.  He reports that he was formally a vape pen user however he has not smoked in about a month.   History reviewed. No pertinent past medical history.  There are no problems to display for this patient.   History reviewed. No pertinent surgical history.     Home Medications    Prior to Admission medications   Medication Sig Start Date End Date Taking? Authorizing Provider  benzonatate (TESSALON) 100 MG capsule Take 1 capsule (100 mg total) by mouth every 8 (eight) hours. 08/04/20  Yes Karlin Heilman M, PA-C  omeprazole (PRILOSEC) 20 MG capsule Take 1 capsule (20 mg total) by mouth daily. 08/04/20  Yes Wileen Duncanson M, PA-C  Benzocaine-Menthol (CEPACOL SORE THROAT) 10-2.1 MG LOZG Use as directed 1 lozenge in the mouth or throat as needed. Patient not taking: Reported on 11/28/2019 09/24/18   Merrilee Jansky, MD  diclofenac Sodium (VOLTAREN) 1 % GEL Apply 4 g topically 4 (four) times daily. 11/28/19   Melene Plan, DO  ibuprofen (ADVIL) 400 MG tablet Take 1 tablet (400 mg total) by mouth every 6 (six) hours as needed. Patient not taking: Reported on 11/28/2019 09/24/18   Merrilee Jansky, MD    Family History Family History  Problem Relation Age of Onset   Hypertension Father     Social History Social History   Tobacco Use   Smoking status: Never   Smokeless tobacco: Never  Substance Use Topics   Alcohol use: Yes   Drug use: Not Currently     Allergies   Patient has no known allergies.   Review of Systems Review of Systems  As stated above in HPI Physical Exam Triage Vital Signs ED Triage Vitals  Enc Vitals Group     BP 08/04/20 0852 131/85     Pulse Rate 08/04/20 0852 62     Resp 08/04/20 0852 14     Temp 08/04/20 0852 98 F (36.7 C)     Temp Source 08/04/20 0852 Oral     SpO2 08/04/20 0852 96 %     Weight --      Height --      Head Circumference --      Peak Flow --      Pain Score 08/04/20 0855 7     Pain Loc --      Pain Edu? --      Excl. in GC? --    No data found.  Updated Vital Signs BP 131/85 (BP Location: Right Arm)  Pulse 62   Temp 98 F (36.7 C) (Oral)   Resp 14   SpO2 96%   Physical Exam Vitals and nursing note reviewed.  Constitutional:      General: He is not in acute distress.    Appearance: Normal appearance. He is not ill-appearing, toxic-appearing or diaphoretic.     Comments: No hoarseness or hot potato voice  HENT:     Head: Normocephalic and atraumatic.     Right Ear: Tympanic membrane normal.     Left Ear: Tympanic membrane normal.     Nose: Nose normal. No congestion or rhinorrhea.     Mouth/Throat:     Mouth: Mucous membranes are moist.     Pharynx: Oropharynx is clear. Posterior oropharyngeal erythema present. No oropharyngeal exudate.  Eyes:     Extraocular Movements: Extraocular movements intact.     Pupils: Pupils are equal, round, and reactive to light.  Cardiovascular:     Rate and Rhythm: Normal rate and regular rhythm.     Heart sounds: Normal heart sounds.  Pulmonary:     Effort: Pulmonary effort is normal. No respiratory distress.     Breath sounds: Normal breath sounds. No  stridor. No wheezing, rhonchi or rales.  Chest:     Chest wall: No tenderness.  Musculoskeletal:     Cervical back: Normal range of motion and neck supple.  Lymphadenopathy:     Cervical: Cervical adenopathy present.  Skin:    General: Skin is warm.  Neurological:     Mental Status: He is alert and oriented to person, place, and time.     UC Treatments / Results  Labs (all labs ordered are listed, but only abnormal results are displayed) Labs Reviewed  CULTURE, GROUP A STREP [facility])  POCT RAPID STREP A, ED / UC  POCT INFECTIOUS MONO SCREEN, ED / UC  CYTOLOGY, (ORAL, ANAL, URETHRAL) ANCILLARY ONLY    EKG   Radiology DG Chest 2 View  Result Date: 08/04/2020 CLINICAL DATA:  Cough for 1 month.  Hemoptysis. EXAM: CHEST - 2 VIEW COMPARISON:  PA and lateral chest 11/27/2019. FINDINGS: Lungs clear. Heart size normal. No pneumothorax or pleural fluid. No bony abnormality. IMPRESSION: Normal chest. Electronically Signed   By: Drusilla Kanner M.D.   On: 08/04/2020 10:03    Procedures Procedures (including critical care time)  Medications Ordered in UC Medications - No data to display  Initial Impression / Assessment and Plan / UC Course  I have reviewed the triage vital signs and the nursing notes.  Pertinent labs & imaging results that were available during my care of the patient were reviewed by me and considered in my medical decision making (see chart for details).     New.  Wide differential.  His x-ray is normal.  His rapid strep and mononucleosis testing are both negative in office today.  Cytology as well as a strep culture have been sent out to the lab.  For now we are going to trial Tessalon and omeprazole to see if this helps with his symptoms.  If not I have recommended that he see a pulmonologist.  Discussed red flag signs and symptoms. Final Clinical Impressions(s) / UC Diagnoses   Final diagnoses:  Cough  Hemoptysis  Sore throat   Discharge Instructions    None    ED Prescriptions     Medication Sig Dispense Auth. Provider   benzonatate (TESSALON) 100 MG capsule Take 1 capsule (100 mg total) by mouth every 8 (eight) hours.  21 capsule Trevonne Nyland M, PA-C   omeprazole (PRILOSEC) 20 MG capsule Take 1 capsule (20 mg total) by mouth daily. 14 capsule Rushie Chestnut, [place]      PDMP not reviewed this encounter.   Rushie Chestnut, [place] 08/04/20 1101

## 2020-08-06 LAB — CULTURE, GROUP A STREP (THRC)

## 2020-08-07 LAB — CYTOLOGY, (ORAL, ANAL, URETHRAL) ANCILLARY ONLY
Chlamydia: NEGATIVE
Comment: NEGATIVE
Comment: NEGATIVE
Comment: NORMAL
Neisseria Gonorrhea: NEGATIVE
Trichomonas: NEGATIVE

## 2020-09-19 ENCOUNTER — Encounter (HOSPITAL_COMMUNITY): Payer: Self-pay | Admitting: *Deleted

## 2020-09-19 ENCOUNTER — Other Ambulatory Visit: Payer: Self-pay

## 2020-09-19 ENCOUNTER — Ambulatory Visit (HOSPITAL_COMMUNITY)
Admission: EM | Admit: 2020-09-19 | Discharge: 2020-09-19 | Disposition: A | Discharge status: Home / Self Care | Payer: BC Managed Care – PPO | Attending: Physician Assistant | Admitting: Physician Assistant

## 2020-09-19 DIAGNOSIS — B370 Candidal stomatitis: Secondary | ICD-10-CM | POA: Diagnosis not present

## 2020-09-19 MED ORDER — NYSTATIN 100000 UNIT/ML MT SUSP: 500000.0000 [IU] | Freq: Four times a day (QID) | OROMUCOSAL | 0 refills | Status: DC

## 2020-09-19 NOTE — ED Provider Notes (Signed)
[facility]    CSN: [id number] Arrival date & time: 09/19/20  0805      History   Chief Complaint Chief Complaint  Patient presents with   Sore Throat    HPI Angel Wheeler is a 25 y.o. male.   Patient with no significant PMH, here c/w sore throat x 3 - 4 months.  He was seen here 6 weeks ago for same, Strep, GC/CT of throat negative.  He was provided trail of omeprazole and benzonatate for his cough to see if that would improve throat, which he did not take because pharmacist informed him it's not for sore throat.  He reports his throat was "very white" yesterday.  Denies allergy like sx.     History reviewed. No pertinent past medical history.  There are no problems to display for this patient.   History reviewed. No pertinent surgical history.     Home Medications    Prior to Admission medications   Medication Sig Start Date End Date Taking? Authorizing Provider  nystatin (MYCOSTATIN) 100000 UNIT/ML suspension Take 5 mLs (500,000 Units total) by mouth 4 (four) times daily. Swish, gargle, and swallow 09/19/20  Yes Evern Core, PA-C  Benzocaine-Menthol (CEPACOL SORE THROAT) 10-2.1 MG LOZG Use as directed 1 lozenge in the mouth or throat as needed. Patient not taking: Reported on 11/28/2019 09/24/18   Merrilee Jansky, MD  benzonatate (TESSALON) 100 MG capsule Take 1 capsule (100 mg total) by mouth every 8 (eight) hours. 08/04/20   Rushie Chestnut, PA-C  diclofenac Sodium (VOLTAREN) 1 % GEL Apply 4 g topically 4 (four) times daily. 11/28/19   Melene Plan, DO  ibuprofen (ADVIL) 400 MG tablet Take 1 tablet (400 mg total) by mouth every 6 (six) hours as needed. Patient not taking: Reported on 11/28/2019 09/24/18   Merrilee Jansky, MD  omeprazole (PRILOSEC) 20 MG capsule Take 1 capsule (20 mg total) by mouth daily. 08/04/20   Rushie Chestnut, PA-C    Family History Family History  Problem Relation Age of Onset   Hypertension Father     Social  History Social History   Tobacco Use   Smoking status: Never   Smokeless tobacco: Never  Substance Use Topics   Alcohol use: Yes   Drug use: Not Currently     Allergies   Patient has no known allergies.   Review of Systems Review of Systems  Constitutional:  Negative for chills, fatigue and fever.  HENT:  Positive for postnasal drip and sore throat. Negative for congestion, ear pain, nosebleeds, rhinorrhea, sinus pressure, sinus pain, sneezing, trouble swallowing and voice change.   Eyes:  Negative for pain, discharge, redness and itching.  Respiratory:  Positive for cough. Negative for shortness of breath and wheezing.   Gastrointestinal:  Negative for abdominal pain, diarrhea, nausea and vomiting.  Musculoskeletal:  Negative for arthralgias and myalgias.  Skin:  Negative for rash.  Neurological:  Negative for light-headedness and headaches.  Hematological:  Negative for adenopathy. Does not bruise/bleed easily.  Psychiatric/Behavioral:  Negative for confusion and sleep disturbance.     Physical Exam Triage Vital Signs ED Triage Vitals  Enc Vitals Group     BP 09/19/20 0829 139/80     Pulse Rate 09/19/20 0829 77     Resp 09/19/20 0829 18     Temp 09/19/20 0829 98.8 F (37.1 C)     Temp src --      SpO2 09/19/20 0829 97 %  Weight --      Height --      Head Circumference --      Peak Flow --      Pain Score 09/19/20 0826 8     Pain Loc --      Pain Edu? --      Excl. in GC? --    No data found.  Updated Vital Signs BP 139/80   Pulse 77   Temp 98.8 F (37.1 C)   Resp 18   SpO2 97%   Visual Acuity Right Eye Distance:   Left Eye Distance:   Bilateral Distance:    Right Eye Near:   Left Eye Near:    Bilateral Near:     Physical Exam Vitals and nursing note reviewed.  Constitutional:      General: He is not in acute distress.    Appearance: Normal appearance. He is not ill-appearing.  HENT:     Head: Normocephalic and atraumatic.     Right  Ear: Tympanic membrane and ear canal normal.     Left Ear: Tympanic membrane and ear canal normal.     Nose: No congestion or rhinorrhea.     Mouth/Throat:     Mouth: No oral lesions.     Pharynx: Uvula midline. Oropharyngeal exudate (white, removable with tongue depressor) present. No posterior oropharyngeal erythema or uvula swelling.     Tonsils: No tonsillar exudate or tonsillar abscesses.  Eyes:     General: No scleral icterus.    Extraocular Movements: Extraocular movements intact.     Conjunctiva/sclera: Conjunctivae normal.  Cardiovascular:     Rate and Rhythm: Normal rate and regular rhythm.     Heart sounds: No murmur heard. Pulmonary:     Effort: Pulmonary effort is normal. No respiratory distress.     Breath sounds: Normal breath sounds. No wheezing or rales.  Musculoskeletal:     Cervical back: Normal range of motion. No rigidity.  Lymphadenopathy:     Cervical: No cervical adenopathy.  Skin:    Coloration: Skin is not jaundiced.     Findings: No rash.  Neurological:     General: No focal deficit present.     Mental Status: He is alert and oriented to person, place, and time.     Motor: No weakness.     Gait: Gait normal.  Psychiatric:        Mood and Affect: Mood normal.        Behavior: Behavior normal.     UC Treatments / Results  Labs (all labs ordered are listed, but only abnormal results are displayed) Labs Reviewed - No data to display  EKG   Radiology No results found.  Procedures Procedures (including critical care time)  Medications Ordered in UC Medications - No data to display  Initial Impression / Assessment and Plan / UC Course  I have reviewed the triage vital signs and the nursing notes.  Pertinent labs & imaging results that were available during my care of the patient were reviewed by me and considered in my medical decision making (see chart for details).     Possible thrush as cause, will trial nystatin Follow up with PCP /  ENT if no improvement Final Clinical Impressions(s) / UC Diagnoses   Final diagnoses:  Thrush   Discharge Instructions   None    ED Prescriptions     Medication Sig Dispense Auth. Provider   nystatin (MYCOSTATIN) 100000 UNIT/ML suspension Take 5 mLs (500,000 Units total)  by mouth 4 (four) times daily. Swish, gargle, and swallow 120 mL Evern Core, PA-C      PDMP not reviewed this encounter.   Evern Core, PA-C 09/19/20 [id number]

## 2020-09-19 NOTE — ED Triage Notes (Signed)
PT reports sore throat for 3-4 months

## 2020-11-15 ENCOUNTER — Other Ambulatory Visit: Payer: Self-pay

## 2020-11-15 ENCOUNTER — Encounter (HOSPITAL_COMMUNITY): Payer: Self-pay

## 2020-11-15 ENCOUNTER — Ambulatory Visit (HOSPITAL_COMMUNITY)
Admission: EM | Admit: 2020-11-15 | Discharge: 2020-11-15 | Disposition: A | Discharge status: Home / Self Care | Payer: BC Managed Care – PPO | Attending: Family Medicine | Admitting: Family Medicine

## 2020-11-15 DIAGNOSIS — R590 Localized enlarged lymph nodes: Secondary | ICD-10-CM | POA: Diagnosis not present

## 2020-11-15 DIAGNOSIS — J029 Acute pharyngitis, unspecified: Secondary | ICD-10-CM

## 2020-11-15 MED ORDER — AMOXICILLIN-POT CLAVULANATE 875-125 MG PO TABS: 1.0000 | ORAL_TABLET | Freq: Two times a day (BID) | ORAL | 0 refills | Status: DC

## 2020-11-15 MED ORDER — DEXAMETHASONE SODIUM PHOSPHATE 10 MG/ML IJ SOLN
INTRAMUSCULAR | Status: AC
  Filled 2020-11-15: qty 1

## 2020-11-15 MED ORDER — CETIRIZINE HCL 10 MG PO TABS: 10.0000 mg | ORAL_TABLET | Freq: Every day | ORAL | 2 refills | Status: DC

## 2020-11-15 MED ORDER — DEXAMETHASONE SODIUM PHOSPHATE 10 MG/ML IJ SOLN
10.0000 mg | Freq: Once | INTRAMUSCULAR | Status: AC
  Administered 2020-11-15: 12:00:00 10 mg via INTRAMUSCULAR

## 2020-11-15 NOTE — ED Triage Notes (Signed)
Pt presents with neck pain x 1 week. States the pain has worsened, states nothing has helped.

## 2020-11-15 NOTE — ED Provider Notes (Signed)
[facility]    CSN: [id number] Arrival date & time: 11/15/20  7654      History   Chief Complaint Chief Complaint  Patient presents with   Neck Pain    HPI Angel Wheeler is a 25 y.o. male.   Patient presenting today with several month history of sore, swollen throat worse on the left side.  States he has been seen at this clinic and diagnosed with thrush, given nystatin mouthwash which did not improve his symptoms.  He is also seeing an ear nose and throat specialist and given some sort of pills but he does not recall what kind of pills.  He states that those did not help either.  He is now having left-sided neck soreness that he feels are swollen lymph nodes related to his sore throat. He denies dysphagia, fever, chills, body aches, CP, SOB.  He states he had this issue about 2 years ago that went on for several months but after several days on antibiotics her symptoms improved.   History reviewed. No pertinent past medical history.  There are no problems to display for this patient.   History reviewed. No pertinent surgical history.     Home Medications    Prior to Admission medications   Medication Sig Start Date End Date Taking? Authorizing Provider  amoxicillin-clavulanate (AUGMENTIN) 875-125 MG tablet Take 1 tablet by mouth every 12 (twelve) hours. 11/15/20  Yes Particia Nearing, PA-C  cetirizine (ZYRTEC ALLERGY) 10 MG tablet Take 1 tablet (10 mg total) by mouth daily. 11/15/20  Yes Particia Nearing, PA-C  Benzocaine-Menthol (CEPACOL SORE THROAT) 10-2.1 MG LOZG Use as directed 1 lozenge in the mouth or throat as needed. Patient not taking: Reported on 11/28/2019 09/24/18   Merrilee Jansky, MD  benzonatate (TESSALON) 100 MG capsule Take 1 capsule (100 mg total) by mouth every 8 (eight) hours. 08/04/20   Rushie Chestnut, PA-C  diclofenac Sodium (VOLTAREN) 1 % GEL Apply 4 g topically 4 (four) times daily. 11/28/19   Melene Plan, DO   ibuprofen (ADVIL) 400 MG tablet Take 1 tablet (400 mg total) by mouth every 6 (six) hours as needed. Patient not taking: Reported on 11/28/2019 09/24/18   Merrilee Jansky, MD  nystatin (MYCOSTATIN) 100000 UNIT/ML suspension Take 5 mLs (500,000 Units total) by mouth 4 (four) times daily. Swish, gargle, and swallow 09/19/20   Evern Core, PA-C  omeprazole (PRILOSEC) 20 MG capsule Take 1 capsule (20 mg total) by mouth daily. 08/04/20   Rushie Chestnut, PA-C    Family History Family History  Problem Relation Age of Onset   Hypertension Father     Social History Social History   Tobacco Use   Smoking status: Never   Smokeless tobacco: Never  Substance Use Topics   Alcohol use: Yes   Drug use: Not Currently     Allergies   Patient has no known allergies.   Review of Systems Review of Systems Per HPI  Physical Exam Triage Vital Signs ED Triage Vitals  Enc Vitals Group     BP 11/15/20 1158 130/85     Pulse Rate 11/15/20 1158 89     Resp 11/15/20 1158 18     Temp 11/15/20 1158 98.9 F (37.2 C)     Temp Source 11/15/20 1158 Oral     SpO2 11/15/20 1158 97 %     Weight --      Height --      Head Circumference --  Peak Flow --      Pain Score 11/15/20 1157 3     Pain Loc --      Pain Edu? --      Excl. in GC? --    No data found.  Updated Vital Signs BP 130/85 (BP Location: Left Arm)   Pulse 89   Temp 98.9 F (37.2 C) (Oral)   Resp 18   SpO2 97%   Visual Acuity Right Eye Distance:   Left Eye Distance:   Bilateral Distance:    Right Eye Near:   Left Eye Near:    Bilateral Near:     Physical Exam Vitals and nursing note reviewed.  Constitutional:      Appearance: Normal appearance.  HENT:     Head: Atraumatic.     Nose:     Comments: Significant erythema and edema bilateral nasal turbinates.  Rhinorrhea present.    Mouth/Throat:     Mouth: Mucous membranes are moist.     Pharynx: Posterior oropharyngeal erythema present.     Comments:  Moderate bilateral tonsillar erythema, uvula also erythematous but midline.  No obvious abscess, exudates Eyes:     Extraocular Movements: Extraocular movements intact.     Conjunctiva/sclera: Conjunctivae normal.  Neck:     Comments: Minimal diffuse cervical adenopathy Cardiovascular:     Rate and Rhythm: Normal rate and regular rhythm.  Pulmonary:     Effort: Pulmonary effort is normal.     Breath sounds: Normal breath sounds. No wheezing or rales.  Abdominal:     General: Bowel sounds are normal. There is no distension.     Palpations: Abdomen is soft.     Tenderness: There is no abdominal tenderness. There is no guarding.  Musculoskeletal:        General: Normal range of motion.     Cervical back: Normal range of motion and neck supple.  Lymphadenopathy:     Cervical: Cervical adenopathy present.  Skin:    General: Skin is warm and dry.  Neurological:     General: No focal deficit present.     Mental Status: He is oriented to person, place, and time.  Psychiatric:        Mood and Affect: Mood normal.        Thought Content: Thought content normal.        Judgment: Judgment normal.   UC Treatments / Results  Labs (all labs ordered are listed, but only abnormal results are displayed) Labs Reviewed - No data to display  EKG   Radiology No results found.  Procedures Procedures (including critical care time)  Medications Ordered in UC Medications  dexamethasone (DECADRON) injection 10 mg (10 mg Intramuscular Given 11/15/20 1222)    Initial Impression / Assessment and Plan / UC Course  I have reviewed the triage vital signs and the nursing notes.  Pertinent labs & imaging results that were available during my care of the patient were reviewed by me and considered in my medical decision making (see chart for details).     Unclear etiology of ongoing symptoms, possibly allergic versus inflammatory.  We will start Zyrtec, give IM Decadron in clinic and per patient  request will start Augmentin as this is helped when this is happened in the past but did discuss the lack of evidence toward a bacterial infection today being present.  Close ENT follow-up recommended if worsening or not resolving.  Work note given.  Final Clinical Impressions(s) / UC Diagnoses   Final  diagnoses:  Sore throat  Cervical lymphadenopathy   Discharge Instructions   None    ED Prescriptions     Medication Sig Dispense Auth. Provider   amoxicillin-clavulanate (AUGMENTIN) 875-125 MG tablet Take 1 tablet by mouth every 12 (twelve) hours. 14 tablet Particia Nearing, [place]   cetirizine (ZYRTEC ALLERGY) 10 MG tablet Take 1 tablet (10 mg total) by mouth daily. 30 tablet Particia Nearing, [place]      PDMP not reviewed this encounter.   Particia Nearing, [place] 11/15/20 1606

## 2020-11-15 NOTE — ED Triage Notes (Signed)
Called pt 3x no response °

## 2021-08-15 ENCOUNTER — Ambulatory Visit (HOSPITAL_COMMUNITY)
Admission: EM | Admit: 2021-08-15 | Discharge: 2021-08-15 | Disposition: A | Discharge status: Home / Self Care | Payer: BC Managed Care – PPO | Attending: Internal Medicine | Admitting: Internal Medicine

## 2021-08-15 ENCOUNTER — Encounter (HOSPITAL_COMMUNITY): Payer: Self-pay | Admitting: Emergency Medicine

## 2021-08-15 DIAGNOSIS — Z202 Contact with and (suspected) exposure to infections with a predominantly sexual mode of transmission: Secondary | ICD-10-CM | POA: Insufficient documentation

## 2021-08-15 LAB — HIV ANTIBODY (ROUTINE TESTING W REFLEX): HIV Screen 4th Generation wRfx: NONREACTIVE

## 2021-08-15 MED ORDER — CEFTRIAXONE SODIUM 500 MG IJ SOLR
500.0000 mg | Freq: Once | INTRAMUSCULAR | Status: AC
  Administered 2021-08-15: 500 mg via INTRAMUSCULAR

## 2021-08-15 MED ORDER — CEFTRIAXONE SODIUM 500 MG IJ SOLR
INTRAMUSCULAR | Status: AC
  Filled 2021-08-15: qty 500

## 2021-08-15 NOTE — ED Provider Notes (Signed)
[facility]    CSN: [id number] Arrival date & time: 08/15/21  0848      History   Chief Complaint Chief Complaint  Patient presents with   Exposure to STD    HPI Angel Wheeler is a 26 y.o. male.   Patient presents to urgent care for evaluation after he was exposed to gonorrhea by his male sexual partner.  Patient denies exposure to other STIs.  He states that his penis is itchy but he denies penile rash, penile discharge, urinary dysuria, urgency, frequency, or hesitancy. No fever/chills. No other recent sexual partners in the last month.  No recent antibiotic use.  Denies abdominal pain, constipation, nausea, diarrhea, and low back pain.  Patient is requesting HIV and syphilis testing today as well.   Exposure to STD    History reviewed. No pertinent past medical history.  There are no problems to display for this patient.   History reviewed. No pertinent surgical history.     Home Medications    Prior to Admission medications   Medication Sig Start Date End Date Taking? Authorizing Provider  amoxicillin-clavulanate (AUGMENTIN) 875-125 MG tablet Take 1 tablet by mouth every 12 (twelve) hours. 11/15/20   Particia Nearing, PA-C  Benzocaine-Menthol (CEPACOL SORE THROAT) 10-2.1 MG LOZG Use as directed 1 lozenge in the mouth or throat as needed. Patient not taking: Reported on 11/28/2019 09/24/18   Merrilee Jansky, MD  benzonatate (TESSALON) 100 MG capsule Take 1 capsule (100 mg total) by mouth every 8 (eight) hours. 08/04/20   Rushie Chestnut, PA-C  cetirizine (ZYRTEC ALLERGY) 10 MG tablet Take 1 tablet (10 mg total) by mouth daily. 11/15/20   Particia Nearing, PA-C  diclofenac Sodium (VOLTAREN) 1 % GEL Apply 4 g topically 4 (four) times daily. 11/28/19   Melene Plan, DO  ibuprofen (ADVIL) 400 MG tablet Take 1 tablet (400 mg total) by mouth every 6 (six) hours as needed. Patient not taking: Reported on 11/28/2019 09/24/18   Merrilee Jansky,  MD  nystatin (MYCOSTATIN) 100000 UNIT/ML suspension Take 5 mLs (500,000 Units total) by mouth 4 (four) times daily. Swish, gargle, and swallow 09/19/20   Evern Core, PA-C  omeprazole (PRILOSEC) 20 MG capsule Take 1 capsule (20 mg total) by mouth daily. 08/04/20   Rushie Chestnut, PA-C    Family History Family History  Problem Relation Age of Onset   Hypertension Father     Social History Social History   Tobacco Use   Smoking status: Never   Smokeless tobacco: Never  Substance Use Topics   Alcohol use: Yes   Drug use: Not Currently     Allergies   Patient has no known allergies.   Review of Systems Review of Systems Per HPI  Physical Exam Triage Vital Signs ED Triage Vitals  Enc Vitals Group     BP 08/15/21 0856 134/87     Pulse Rate 08/15/21 0856 82     Resp 08/15/21 0856 16     Temp 08/15/21 0856 98.1 F (36.7 C)     Temp Source 08/15/21 0856 Oral     SpO2 08/15/21 0856 97 %     Weight --      Height --      Head Circumference --      Peak Flow --      Pain Score 08/15/21 0855 0     Pain Loc --      Pain Edu? --  Excl. in GC? --    No data found.  Updated Vital Signs BP 134/87 (BP Location: Left Arm)   Pulse 82   Temp 98.1 F (36.7 C) (Oral)   Resp 16   SpO2 97%   Visual Acuity Right Eye Distance:   Left Eye Distance:   Bilateral Distance:    Right Eye Near:   Left Eye Near:    Bilateral Near:     Physical Exam Vitals and nursing note reviewed.  Constitutional:      Appearance: Normal appearance. He is not ill-appearing or toxic-appearing.     Comments: Very pleasant patient sitting on exam in position of comfort table in no acute distress.   HENT:     Head: Normocephalic and atraumatic.     Right Ear: Hearing and external ear normal.     Left Ear: Hearing and external ear normal.     Nose: Nose normal.     Mouth/Throat:     Lips: Pink.     Mouth: Mucous membranes are moist.  Eyes:     General: Lids are normal. Vision  grossly intact. Gaze aligned appropriately.     Extraocular Movements: Extraocular movements intact.     Conjunctiva/sclera: Conjunctivae normal.  Pulmonary:     Effort: Pulmonary effort is normal.  Abdominal:     Palpations: Abdomen is soft.  Musculoskeletal:     Cervical back: Neck supple.  Skin:    General: Skin is warm and dry.     Capillary Refill: Capillary refill takes less than 2 seconds.     Findings: No rash.  Neurological:     General: No focal deficit present.     Mental Status: He is alert and oriented to person, place, and time. Mental status is at baseline.     Cranial Nerves: No dysarthria or facial asymmetry.     Gait: Gait is intact.  Psychiatric:        Mood and Affect: Mood normal.        Speech: Speech normal.        Behavior: Behavior normal.        Thought Content: Thought content normal.        Judgment: Judgment normal.      UC Treatments / Results  Labs (all labs ordered are listed, but only abnormal results are displayed) Labs Reviewed  HIV ANTIBODY (ROUTINE TESTING W REFLEX)  [facility]PR  CYTOLOGY, (ORAL, ANAL, URETHRAL) ANCILLARY ONLY    EKG   Radiology No results found.  Procedures Procedures (including critical care time)  Medications Ordered in UC Medications  cefTRIAXone (ROCEPHIN) injection 500 mg (has no administration in time range)    Initial Impression / Assessment and Plan / UC Course  I have reviewed the triage vital signs and the nursing notes.  Pertinent labs & imaging results that were available during my care of the patient were reviewed by me and considered in my medical decision making (see chart for details).  STD screening  STI labs pending.  Patient would like HIV and syphilis testing today.  Will notify patient of positive results and treat accordingly when labs come back.  Patient to avoid sexual intercourse until screening testing comes back.  Education provided regarding safe sexual practices and patient encouraged  to use protection to prevent spread of STIs.  Plan to treat empirically with Rocephin injection in the clinic today due to positive exposure of gonorrhea.  Patient to avoid sexual intercourse for 7 days while being treated  for STI.  We will call him and treat for all other positive results once testing comes back.   Discussed physical exam and available lab work findings in clinic with patient.  Counseled patient regarding appropriate use of medications and potential side effects for all medications recommended or prescribed today. Discussed red flag signs and symptoms of worsening condition,when to call the PCP office, return to urgent care, and when to seek higher level of care in the emergency department. Patient verbalizes understanding and agreement with plan. All questions answered. Patient discharged in stable condition.  Final Clinical Impressions(s) / UC Diagnoses   Final diagnoses:  Exposure to STD     Discharge Instructions      You were tested today for STDs and the results are still pending; you have been given treatment for possible infections presumptively anyway due to your symptoms. You will receive a phone call in approximately 3 days if the results are positive. You should follow up with your primary care provider for further STI testing.  Avoid sexual intercourse for 7 days or until symptoms have resolved. Advise your sexual partner(s) to be evaluated, tested and treated. This includes all sexual partners within the past 60 days or your last sexual partner if last contact was greater than 60 days.  To minimize the risk of reinfection, you should abstain from sexual intercourse until your sexual partners have been tested and treated.  Consistent condom use is important in preventing the spread of sexually transmitted infections.  We will treat for any other positive results when your labs come back. If you do not hear from [place], this means that your STI testing was negative or  there is no change to your treatment plan. You will also receive these results via MyChart.   Return if you experience fevers 100.4 or greater, worsening or uncontrolled pain, rashes, sores, vomiting, or for any other concerning symptoms.       ED Prescriptions   None    PDMP not reviewed this encounter.   Reita May [place], [place] 08/15/21 [phone]

## 2021-08-15 NOTE — ED Triage Notes (Signed)
Pt reports that got notified from a sexual partner that she was positive for gonorrhea. Pt reports itching for "while". Denies discharge or urinary problems.

## 2021-08-15 NOTE — Discharge Instructions (Signed)
You were tested today for STDs and the results are still pending; you have been given treatment for possible infections presumptively anyway due to your symptoms. You will receive a phone call in approximately 3 days if the results are positive. You should follow up with your primary care provider for further STI testing.  Avoid sexual intercourse for 7 days or until symptoms have resolved. Advise your sexual partner(s) to be evaluated, tested and treated. This includes all sexual partners within the past 60 days or your last sexual partner if last contact was greater than 60 days.  To minimize the risk of reinfection, you should abstain from sexual intercourse until your sexual partners have been tested and treated.  Consistent condom use is important in preventing the spread of sexually transmitted infections.  We will treat for any other positive results when your labs come back. If you do not hear from [place], this means that your STI testing was negative or there is no change to your treatment plan. You will also receive these results via MyChart.   Return if you experience fevers 100.4 or greater, worsening or uncontrolled pain, rashes, sores, vomiting, or for any other concerning symptoms.

## 2021-08-16 LAB — CYTOLOGY, (ORAL, ANAL, URETHRAL) ANCILLARY ONLY
Chlamydia: NEGATIVE
Comment: NEGATIVE
Comment: NORMAL
Neisseria Gonorrhea: NEGATIVE

## 2021-08-17 LAB — T.PALLIDUM AB, TOTAL: T Pallidum Abs: REACTIVE — AB

## 2021-08-20 LAB — RPR
RPR Ser Ql: REACTIVE — AB
RPR Titer: 1:4 {titer}

## 2021-08-21 ENCOUNTER — Telehealth (HOSPITAL_COMMUNITY): Payer: Self-pay | Admitting: Emergency Medicine

## 2021-08-21 NOTE — Telephone Encounter (Signed)
PAtient did confirm no history of syphilis or treatment.   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   Contacted patient by phone.  Verified identity using two identifiers.  Provided positive result.  Reviewed safe sex practices, notifying partners, and refraining from sexual activities for 7 days from time of treatment.  Patient verified understanding, all questions answered.   [facility] notified

## 2021-08-23 ENCOUNTER — Ambulatory Visit (HOSPITAL_COMMUNITY): Payer: BC Managed Care – PPO

## 2021-08-23 ENCOUNTER — Other Ambulatory Visit: Payer: Self-pay

## 2021-08-23 ENCOUNTER — Ambulatory Visit (HOSPITAL_COMMUNITY)
Admission: RE | Admit: 2021-08-23 | Discharge: 2021-08-23 | Disposition: A | Discharge status: Home / Self Care | Payer: BC Managed Care – PPO | Source: Ambulatory Visit | Attending: Family Medicine | Admitting: Family Medicine

## 2021-08-23 DIAGNOSIS — A539 Syphilis, unspecified: Secondary | ICD-10-CM | POA: Diagnosis not present

## 2021-08-23 MED ORDER — PENICILLIN G BENZATHINE 1200000 UNIT/2ML IM SUSY
PREFILLED_SYRINGE | INTRAMUSCULAR | Status: AC
  Filled 2021-08-23: qty 4

## 2021-08-23 MED ORDER — PENICILLIN G BENZATHINE 1200000 UNIT/2ML IM SUSY
2.4000 10*6.[IU] | PREFILLED_SYRINGE | Freq: Once | INTRAMUSCULAR | Status: AC
  Administered 2021-08-23: 2.4 10*6.[IU] via INTRAMUSCULAR

## 2021-08-23 NOTE — ED Triage Notes (Signed)
Pt present today after a call from K. P. RN for treatment for STD.

## 2022-02-02 ENCOUNTER — Ambulatory Visit (HOSPITAL_COMMUNITY)
Admission: EM | Admit: 2022-02-02 | Discharge: 2022-02-02 | Disposition: A | Discharge status: Home / Self Care | Payer: BC Managed Care – PPO | Attending: Physician Assistant | Admitting: Physician Assistant

## 2022-02-02 ENCOUNTER — Encounter (HOSPITAL_COMMUNITY): Payer: Self-pay

## 2022-02-02 DIAGNOSIS — Z1152 Encounter for screening for COVID-19: Secondary | ICD-10-CM | POA: Diagnosis not present

## 2022-02-02 DIAGNOSIS — J029 Acute pharyngitis, unspecified: Secondary | ICD-10-CM | POA: Diagnosis present

## 2022-02-02 DIAGNOSIS — Z28310 Unvaccinated for covid-19: Secondary | ICD-10-CM | POA: Insufficient documentation

## 2022-02-02 DIAGNOSIS — R051 Acute cough: Secondary | ICD-10-CM | POA: Insufficient documentation

## 2022-02-02 DIAGNOSIS — R519 Headache, unspecified: Secondary | ICD-10-CM | POA: Diagnosis present

## 2022-02-02 DIAGNOSIS — J069 Acute upper respiratory infection, unspecified: Secondary | ICD-10-CM | POA: Insufficient documentation

## 2022-02-02 MED ORDER — PROMETHAZINE-DM 6.25-15 MG/5ML PO SYRP: 5.0000 mL | ORAL_SOLUTION | Freq: Four times a day (QID) | ORAL | 0 refills | Status: AC | PRN

## 2022-02-02 MED ORDER — CETIRIZINE HCL 10 MG PO TABS: 10.0000 mg | ORAL_TABLET | Freq: Every day | ORAL | 1 refills | Status: AC

## 2022-02-02 MED ORDER — FLUTICASONE PROPIONATE 50 MCG/ACT NA SUSP: 1.0000 | Freq: Every day | NASAL | 0 refills | Status: AC

## 2022-02-02 NOTE — ED Provider Notes (Signed)
[facility]    CSN: [id number] Arrival date & time: 02/02/22  1237      History   Chief Complaint Chief Complaint  Patient presents with   Sore Throat   Headache   Cough    HPI Angel Wheeler is a 27 y.o. male.   Patient presents today with a 3 to 4-day history of URI symptoms including cough, congestion, sore throat, headache.  Denies any chest pain, shortness of breath, nausea, vomiting, diarrhea, fever.  Denies any known sick contacts but has attended a funeral recently and notes that several people who also attended the funeral have since become sick.  Does not know what they were diagnosed with.  He has tried several medications including vitamin C, ginger root, TheraFlu without improvement of symptoms.  Denies any recent antibiotics or steroids.  He has not had COVID-19 vaccination.  He has not had COVID in the past.  Denies history of allergies, asthma, COPD, smoking.    History reviewed. No pertinent past medical history.  There are no problems to display for this patient.   History reviewed. No pertinent surgical history.     Home Medications    Prior to Admission medications   Medication Sig Start Date End Date Taking? Authorizing Provider  fluticasone (FLONASE) 50 MCG/ACT nasal spray Place 1 spray into both nostrils daily. 02/02/22  Yes Richard Holz K, PA-C  promethazine-dextromethorphan (PROMETHAZINE-DM) 6.25-15 MG/5ML syrup Take 5 mLs by mouth 4 (four) times daily as needed for cough. 02/02/22  Yes Afreen Siebels, Derry Skill, PA-C  cetirizine (ZYRTEC ALLERGY) 10 MG tablet Take 1 tablet (10 mg total) by mouth daily. 02/02/22   Geriann Lafont, Derry Skill, PA-C    Family History Family History  Problem Relation Age of Onset   Hypertension Father     Social History Social History   Tobacco Use   Smoking status: Never   Smokeless tobacco: Never  Substance Use Topics   Alcohol use: Yes   Drug use: Not Currently     Allergies   Patient has no known  allergies.   Review of Systems Review of Systems  Constitutional:  Positive for activity change. Negative for appetite change, fatigue and fever.  HENT:  Positive for congestion and sinus pressure. Negative for sneezing and sore throat.   Respiratory:  Positive for cough. Negative for shortness of breath.   Cardiovascular:  Negative for chest pain.  Gastrointestinal:  Negative for abdominal pain, diarrhea, nausea and vomiting.  Neurological:  Negative for dizziness, light-headedness and headaches.     Physical Exam Triage Vital Signs ED Triage Vitals  Enc Vitals Group     BP 02/02/22 1422 126/88     Pulse Rate 02/02/22 1422 90     Resp 02/02/22 1422 14     Temp 02/02/22 1422 98.6 F (37 C)     Temp Source 02/02/22 1422 Oral     SpO2 02/02/22 1422 95 %     Weight --      Height --      Head Circumference --      Peak Flow --      Pain Score 02/02/22 1421 8     Pain Loc --      Pain Edu? --      Excl. in [place]? --    No data found.  Updated Vital Signs BP 126/88 (BP Location: Right Arm)   Pulse 90   Temp 98.6 F (37 C) (Oral)   Resp 14  SpO2 95%   Visual Acuity Right Eye Distance:   Left Eye Distance:   Bilateral Distance:    Right Eye Near:   Left Eye Near:    Bilateral Near:     Physical Exam Vitals reviewed.  Constitutional:      General: He is awake.     Appearance: Normal appearance. He is well-developed. He is not ill-appearing.     Comments: Very pleasant male appears stated age no acute distress sitting comfortably in exam room  HENT:     Head: Normocephalic and atraumatic.     Right Ear: Tympanic membrane, ear canal and external ear normal. Tympanic membrane is not erythematous or bulging.     Left Ear: Tympanic membrane, ear canal and external ear normal. Tympanic membrane is not erythematous or bulging.     Nose: Nose normal.     Mouth/Throat:     Pharynx: Uvula midline. Posterior oropharyngeal erythema present. No oropharyngeal exudate.   Cardiovascular:     Rate and Rhythm: Normal rate and regular rhythm.     Heart sounds: Normal heart sounds, S1 normal and S2 normal. No murmur heard. Pulmonary:     Effort: Pulmonary effort is normal. No accessory muscle usage or respiratory distress.     Breath sounds: Normal breath sounds. No stridor. No wheezing, rhonchi or rales.     Comments: Clear to auscultation bilaterally Abdominal:     General: Bowel sounds are normal.     Palpations: Abdomen is soft.     Tenderness: There is no abdominal tenderness.  Neurological:     Mental Status: He is alert.  Psychiatric:        Behavior: Behavior is cooperative.      UC Treatments / Results  Labs (all labs ordered are listed, but only abnormal results are displayed) Labs Reviewed  SARS CORONAVIRUS 2 (TAT 6-24 HRS)    EKG   Radiology No results found.  Procedures Procedures (including critical care time)  Medications Ordered in UC Medications - No data to display  Initial Impression / Assessment and Plan / UC Course  I have reviewed the triage vital signs and the nursing notes.  Pertinent labs & imaging results that were available during my care of the patient were reviewed by me and considered in my medical decision making (see chart for details).     Patient is well-appearing, afebrile, nontoxic, nontachycardic.  Flu testing was deferred as he has been symptomatic for over 48 hours and outside of the window of effectiveness for Tamiflu.  COVID testing was obtained today and is pending.  He was provided work excuse note with current CDC return to work guidelines based on COVID test result.  If he is positive, he is not a candidate for antivirals given he is young and otherwise healthy.  He was started on medication department for symptoms including cetirizine and Flonase for congestion and Promethazine DM for cough.  Discussed that Promethazine DM can be sedating and he is not to drive or drink alcohol with taking it.   He can use over-the-counter medication for additional symptom relief.  Recommended that he rest and drink plenty of fluid.  If his symptoms or not improving within a week he is to return for reevaluation.  If he has any worsening or changing symptoms including worsening cough, chest pain, shortness of breath, fever, weakness, nausea/vomiting interfering with oral intake he needs to be seen immediately.  Strict return precautions given.  Final Clinical Impressions(s) / UC Diagnoses  Final diagnoses:  Upper respiratory tract infection, unspecified type  Acute cough     Discharge Instructions      I believe you have a virus.  We tested you for COVID.  We will contact you if you are positive for COVID within the next few days.  Use cetirizine to help with your congestion as well as Flonase.  Start Promethazine DM for cough.  This will make you sleepy so do not drive or drink alcohol taking it.  Make sure you rest and drink plenty of fluid.  If your symptoms are improving by next week please return for reevaluation.  If anything worsens and you have high fever, chest pain, shortness of breath, nausea/vomiting interfering with oral intake you need to be seen immediately.     ED Prescriptions     Medication Sig Dispense Auth. Provider   cetirizine (ZYRTEC ALLERGY) 10 MG tablet Take 1 tablet (10 mg total) by mouth daily. 30 tablet Leyna Vanderkolk K, PA-C   fluticasone (FLONASE) 50 MCG/ACT nasal spray Place 1 spray into both nostrils daily. 16 g Brownie Nehme K, PA-C   promethazine-dextromethorphan (PROMETHAZINE-DM) 6.25-15 MG/5ML syrup Take 5 mLs by mouth 4 (four) times daily as needed for cough. 118 mL Alianys Chacko K, PA-C      PDMP not reviewed this encounter.   Terrilee Croak, PA-C 02/02/22 1445

## 2022-02-02 NOTE — Discharge Instructions (Addendum)
I believe you have a virus.  We tested you for COVID.  We will contact you if you are positive for COVID within the next few days.  Use cetirizine to help with your congestion as well as Flonase.  Start Promethazine DM for cough.  This will make you sleepy so do not drive or drink alcohol taking it.  Make sure you rest and drink plenty of fluid.  If your symptoms are improving by next week please return for reevaluation.  If anything worsens and you have high fever, chest pain, shortness of breath, nausea/vomiting interfering with oral intake you need to be seen immediately.

## 2022-02-02 NOTE — ED Triage Notes (Signed)
Patient states that he has been feeling bad since Wednesday. Sore throat, headache, cough.  Taking Ginger root. Took theraflu yesterday.

## 2022-02-03 LAB — SARS CORONAVIRUS 2 (TAT 6-24 HRS): SARS Coronavirus 2: NEGATIVE

## 2022-10-04 ENCOUNTER — Encounter (HOSPITAL_COMMUNITY): Payer: Self-pay | Admitting: *Deleted

## 2022-10-04 ENCOUNTER — Emergency Department (HOSPITAL_COMMUNITY)
Admission: EM | Admit: 2022-10-04 | Discharge: 2022-10-05 | Disposition: A | Discharge status: Home / Self Care | Payer: BC Managed Care – PPO | Attending: Emergency Medicine | Admitting: Emergency Medicine

## 2022-10-04 ENCOUNTER — Other Ambulatory Visit: Payer: Self-pay

## 2022-10-04 DIAGNOSIS — S199XXA Unspecified injury of neck, initial encounter: Secondary | ICD-10-CM | POA: Diagnosis present

## 2022-10-04 DIAGNOSIS — S161XXA Strain of muscle, fascia and tendon at neck level, initial encounter: Secondary | ICD-10-CM | POA: Insufficient documentation

## 2022-10-04 DIAGNOSIS — Y92410 Unspecified street and highway as the place of occurrence of the external cause: Secondary | ICD-10-CM | POA: Insufficient documentation
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
## 2022-10-05 MED ORDER — METHOCARBAMOL 500 MG PO TABS: 500.0000 mg | ORAL_TABLET | Freq: Two times a day (BID) | ORAL | 0 refills | Status: AC | PRN
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
## 2022-12-27 ENCOUNTER — Ambulatory Visit (HOSPITAL_COMMUNITY)
Admission: EM | Admit: 2022-12-27 | Discharge: 2022-12-27 | Disposition: A | Discharge status: Home / Self Care | Payer: BC Managed Care – PPO | Attending: Emergency Medicine | Admitting: Emergency Medicine

## 2022-12-27 ENCOUNTER — Encounter (HOSPITAL_COMMUNITY): Payer: Self-pay | Admitting: Emergency Medicine

## 2022-12-27 DIAGNOSIS — Z113 Encounter for screening for infections with a predominantly sexual mode of transmission: Secondary | ICD-10-CM

## 2022-12-27 DIAGNOSIS — J029 Acute pharyngitis, unspecified: Secondary | ICD-10-CM | POA: Diagnosis present

## 2022-12-27 DIAGNOSIS — N5082 Scrotal pain: Secondary | ICD-10-CM | POA: Diagnosis present

## 2022-12-27 LAB — POCT URINALYSIS DIP (MANUAL ENTRY)
Bilirubin, UA: NEGATIVE
Blood, UA: NEGATIVE
Glucose, UA: NEGATIVE mg/dL
Ketones, POC UA: NEGATIVE mg/dL
Leukocytes, UA: NEGATIVE
Nitrite, UA: NEGATIVE
Protein Ur, POC: NEGATIVE mg/dL
Spec Grav, UA: 1.02 (ref 1.010–1.025)
Urobilinogen, UA: 0.2 U/dL
pH, UA: 7 (ref 5.0–8.0)

## 2022-12-27 LAB — HIV ANTIBODY (ROUTINE TESTING W REFLEX): HIV Screen 4th Generation wRfx: NONREACTIVE

## 2022-12-27 NOTE — Discharge Instructions (Addendum)
Today you have been screened for sexually transmitted infections.  Results will be available over the next few days and our staff will contact you if there are any positives to initiate the appropriate treatment.  Abstain from intercourse until results have been received. Your urine did not show any signs of infection.  For your sore throat you do warm saline gargles, tea with honey and over-the-counter cough drops.  This is most likely viral and should resolve over the next 5 to 7 days.  Do not hesitate to seek care at the nearest emergency department if you develop fever, vomiting, scrotal swelling or sudden extreme scrotal pain.

## 2022-12-27 NOTE — ED Provider Notes (Signed)
[facility]    CSN: [id number] Arrival date & time: 12/27/22  1234      History   Chief Complaint Chief Complaint  Patient presents with   SEXUALLY TRANSMITTED DISEASE    HPI Angel Wheeler is a 27 y.o. male.   Patient presents to clinic requesting sexually-transmitted infection screening.  He is having some testicular discomfort since unprotected sexual intercourse on Saturday.  Denies any dysuria.  No penile sores or lesions.  No penile discharge.  He is also having a sore throat over the past few days.  He has been using cough drops.  He does have a mild cough.  Also having left ear pain.  No drainage.  Denies any fevers.  No wheezing or shortness of breath.  No abdominal pain, nausea, vomiting or diarrhea.  The history is provided by the patient and medical records.    History reviewed. No pertinent past medical history.  There are no problems to display for this patient.   History reviewed. No pertinent surgical history.     Home Medications    Prior to Admission medications   Medication Sig Start Date End Date Taking? Authorizing Provider  cetirizine (ZYRTEC ALLERGY) 10 MG tablet Take 1 tablet (10 mg total) by mouth daily. 02/02/22   Raspet, Denny Peon K, PA-C  fluticasone (FLONASE) 50 MCG/ACT nasal spray Place 1 spray into both nostrils daily. 02/02/22   Raspet, Noberto Retort, PA-C  methocarbamol (ROBAXIN) 500 MG tablet Take 1 tablet (500 mg total) by mouth 2 (two) times daily as needed for muscle spasms. Patient not taking: Reported on 12/27/2022 10/05/22   Tilden Fossa, MD  promethazine-dextromethorphan (PROMETHAZINE-DM) 6.25-15 MG/5ML syrup Take 5 mLs by mouth 4 (four) times daily as needed for cough. Patient not taking: Reported on 12/27/2022 02/02/22   Raspet, Noberto Retort, PA-C    Family History Family History  Problem Relation Age of Onset   Hypertension Father     Social History Social History   Tobacco Use   Smoking status: Never   Smokeless tobacco:  Never  Substance Use Topics   Alcohol use: Yes   Drug use: Not Currently     Allergies   Patient has no known allergies.   Review of Systems Review of Systems  Per HPI   Physical Exam Triage Vital Signs ED Triage Vitals  Encounter Vitals Group     BP 12/27/22 1330 134/88     Systolic BP Percentile --      Diastolic BP Percentile --      Pulse Rate 12/27/22 1330 87     Resp 12/27/22 1330 16     Temp 12/27/22 1330 98 F (36.7 C)     Temp Source 12/27/22 1330 Oral     SpO2 12/27/22 1330 94 %     Weight --      Height --      Head Circumference --      Peak Flow --      Pain Score 12/27/22 1329 10     Pain Loc --      Pain Education --      Exclude from Growth Chart --    No data found.  Updated Vital Signs BP 134/88 (BP Location: Right Arm)   Pulse 87   Temp 98 F (36.7 C) (Oral)   Resp 16   SpO2 94%   Visual Acuity Right Eye Distance:   Left Eye Distance:   Bilateral Distance:    Right Eye  Near:   Left Eye Near:    Bilateral Near:     Physical Exam Vitals and nursing note reviewed.  Constitutional:      Appearance: Normal appearance.  HENT:     Head: Normocephalic and atraumatic.     Right Ear: External ear normal.     Left Ear: External ear normal.     Nose: Nose normal.     Mouth/Throat:     Mouth: Mucous membranes are moist.     Pharynx: Posterior oropharyngeal erythema present.  Eyes:     Conjunctiva/sclera: Conjunctivae normal.  Cardiovascular:     Rate and Rhythm: Normal rate and regular rhythm.     Heart sounds: Normal heart sounds. No murmur heard. Pulmonary:     Effort: Pulmonary effort is normal. No respiratory distress.     Breath sounds: Normal breath sounds.  Musculoskeletal:        General: Normal range of motion.  Skin:    General: Skin is warm and dry.  Neurological:     General: No focal deficit present.     Mental Status: He is alert.  Psychiatric:        Mood and Affect: Mood normal.      UC Treatments /  Results  Labs (all labs ordered are listed, but only abnormal results are displayed) Labs Reviewed  RPR  HIV ANTIBODY (ROUTINE TESTING W REFLEX)  POCT URINALYSIS DIP (MANUAL ENTRY)  CYTOLOGY, (ORAL, ANAL, URETHRAL) ANCILLARY ONLY    EKG   Radiology No results found.  Procedures Procedures (including critical care time)  Medications Ordered in UC Medications - No data to display  Initial Impression / Assessment and Plan / UC Course  I have reviewed the triage vital signs and the nursing notes.  Pertinent labs & imaging results that were available during my care of the patient were reviewed by me and considered in my medical decision making (see chart for details).  Vitals and triage reviewed, patient is hemodynamically stable.  Posterior pharynx with slight erythema, no exudate and uvula is midline.  Lungs are vesicular, heart with regular rate and rhythm.  Urinalysis is clear, low concern for epididymitis.  Cytology swab and lab work obtained for STI screening.  Strict emergency and follow-up precautions given, no questions at this time.     Final Clinical Impressions(s) / UC Diagnoses   Final diagnoses:  Scrotal pain  Screening examination for sexually transmitted disease  Acute pharyngitis, unspecified etiology     Discharge Instructions      Today you have been screened for sexually transmitted infections.  Results will be available over the next few days and our staff will contact you if there are any positives to initiate the appropriate treatment.  Abstain from intercourse until results have been received. Your urine did not show any signs of infection.  For your sore throat you do warm saline gargles, tea with honey and over-the-counter cough drops.  This is most likely viral and should resolve over the next 5 to 7 days.  Do not hesitate to seek care at the nearest emergency department if you develop fever, vomiting, scrotal swelling or sudden extreme scrotal  pain.      ED Prescriptions   None    PDMP not reviewed this encounter.   Malynn Lucy, [place], [place] 12/27/22 [phone]

## 2022-12-27 NOTE — ED Triage Notes (Signed)
Pt presents for STD testing after having new partner. States his groin and testicle hurt and throat is sore.

## 2022-12-28 LAB — RPR
RPR Ser Ql: REACTIVE — AB
RPR Titer: 1:1 {titer}

## 2022-12-30 ENCOUNTER — Telehealth (HOSPITAL_COMMUNITY): Payer: Self-pay

## 2022-12-30 LAB — CYTOLOGY, (ORAL, ANAL, URETHRAL) ANCILLARY ONLY
Chlamydia: POSITIVE — AB
Comment: NEGATIVE
Comment: NEGATIVE
Comment: NORMAL
Neisseria Gonorrhea: NEGATIVE
Trichomonas: NEGATIVE

## 2022-12-30 LAB — T.PALLIDUM AB, TOTAL: T Pallidum Abs: REACTIVE — AB

## 2022-12-30 MED ORDER — DOXYCYCLINE HYCLATE 100 MG PO CAPS: 100.0000 mg | ORAL_CAPSULE | Freq: Two times a day (BID) | ORAL | 0 refills | Status: AC

## 2022-12-30 NOTE — Telephone Encounter (Signed)
Pt requires tx with Doxycycline per protocol for tx of chlamydia.

## 2023-04-02 ENCOUNTER — Ambulatory Visit (HOSPITAL_COMMUNITY)
Admission: EM | Admit: 2023-04-02 | Discharge: 2023-04-02 | Disposition: A | Discharge status: Home / Self Care | Attending: Internal Medicine | Admitting: Internal Medicine

## 2023-04-02 ENCOUNTER — Encounter (HOSPITAL_COMMUNITY): Payer: Self-pay

## 2023-04-02 DIAGNOSIS — J069 Acute upper respiratory infection, unspecified: Secondary | ICD-10-CM

## 2023-04-02 LAB — POCT INFLUENZA A/B
Influenza A, POC: NEGATIVE
Influenza B, POC: NEGATIVE

## 2023-04-02 MED ORDER — KETOROLAC TROMETHAMINE 30 MG/ML IJ SOLN
INTRAMUSCULAR | Status: AC
  Filled 2023-04-02: qty 1

## 2023-04-02 MED ORDER — IBUPROFEN 800 MG PO TABS: 800.0000 mg | ORAL_TABLET | Freq: Three times a day (TID) | ORAL | 0 refills | Status: DC

## 2023-04-02 MED ORDER — KETOROLAC TROMETHAMINE 30 MG/ML IJ SOLN
30.0000 mg | Freq: Once | INTRAMUSCULAR | Status: AC
  Administered 2023-04-02: 30 mg via INTRAMUSCULAR

## 2023-04-02 NOTE — ED Provider Notes (Signed)
 [facility]  Note:  This document was prepared using Dragon voice recognition software and may include unintentional dictation errors.  MRN: [id number] DOB: 09-May-1995  Subjective:   Angel Wheeler is a 28 y.o. male presenting for headache, fever, cough x 3 days.  Patient denies any known sick contacts.  Taking TheraFlu and Tylenol cold and flu with minimal improvement.  No testing prior to today's visit.  No shortness of breath, chest pain, weakness, dizziness, wheezing, sore throat, body aches.  No current facility-administered medications for this encounter.  Current Outpatient Medications:    ibuprofen (ADVIL) 800 MG tablet, Take 1 tablet (800 mg total) by mouth 3 (three) times daily., Disp: 21 tablet, Rfl: 0   cetirizine (ZYRTEC ALLERGY) 10 MG tablet, Take 1 tablet (10 mg total) by mouth daily., Disp: 30 tablet, Rfl: 1   doxycycline (VIBRAMYCIN) 100 MG capsule, Take 1 capsule (100 mg total) by mouth 2 (two) times daily., Disp: 14 capsule, Rfl: 0   fluticasone (FLONASE) 50 MCG/ACT nasal spray, Place 1 spray into both nostrils daily., Disp: 16 g, Rfl: 0   methocarbamol (ROBAXIN) 500 MG tablet, Take 1 tablet (500 mg total) by mouth 2 (two) times daily as needed for muscle spasms. (Patient not taking: Reported on 12/27/2022), Disp: 14 tablet, Rfl: 0   promethazine-dextromethorphan (PROMETHAZINE-DM) 6.25-15 MG/5ML syrup, Take 5 mLs by mouth 4 (four) times daily as needed for cough. (Patient not taking: Reported on 12/27/2022), Disp: 118 mL, Rfl: 0   No Known Allergies  History reviewed. No pertinent past medical history.   History reviewed. No pertinent surgical history.  Family History  Problem Relation Age of Onset   Hypertension Father     Social History   Tobacco Use   Smoking status: Never   Smokeless tobacco: Never  Vaping Use   Vaping status: Never Used  Substance Use Topics   Alcohol use: Yes   Drug use: Not Currently    ROS Refer to HPI for ROS  details.  Objective:   Vitals: BP 127/88 (BP Location: Right Arm)   Pulse 97   Temp 99.9 F (37.7 C) (Oral)   Resp 16   SpO2 94%   Physical Exam Vitals and nursing note reviewed.  Constitutional:      General: He is not in acute distress.    Appearance: He is well-developed and normal weight. He is not ill-appearing or toxic-appearing.  HENT:     Head: Normocephalic and atraumatic.  Eyes:     Conjunctiva/sclera: Conjunctivae normal.  Cardiovascular:     Rate and Rhythm: Normal rate and regular rhythm.     Heart sounds: No murmur heard. Pulmonary:     Effort: Pulmonary effort is normal. No respiratory distress.     Breath sounds: Normal breath sounds. No stridor. No wheezing, rhonchi or rales.  Musculoskeletal:     Cervical back: Normal range of motion and neck supple.  Skin:    General: Skin is warm and dry.  Neurological:     Mental Status: He is alert and oriented to person, place, and time.  Psychiatric:        Mood and Affect: Mood normal.     Procedures  Results for orders placed or performed during the hospital encounter of 04/02/23 (from the past 24 hours)  POC Influenza A/B     Status: None   Collection Time: 04/02/23 10:54 AM  Result Value Ref Range   Influenza A, POC Negative Negative   Influenza B, POC  Negative Negative    Assessment and Plan :   PDMP not reviewed this encounter.  1. Viral upper respiratory tract infection    Viral URI with headache -Rapid influenza testing performed in UC is negative -PCR COVID collected in UC sent to lab for further testing results should be available within 24 hours -Continue using over-the-counter cough medication and pain reliever for viral symptoms. -Take prescribed ibuprofen 800 mg tablet every 8 hours as needed for pain and inflammation secondary to viral illness. -Continue to monitor symptoms for any change in severity, if any escalation of symptoms follow-up for further evaluation and management in the  ER.  Lucky Cowboy   [place], [place], [place] 04/02/23 1110

## 2023-04-02 NOTE — ED Triage Notes (Signed)
 Patient c/o chills and a headache x 3 days.  Patient states he has taken TheraFlu and Tylenol Cold and Flu.

## 2023-04-02 NOTE — Discharge Instructions (Addendum)
 Viral URI with headache -Rapid influenza testing performed in UC is negative -PCR COVID collected in UC sent to lab for further testing results should be available within 24 hours -Continue using over-the-counter cough medication and pain reliever for viral symptoms. -Take prescribed ibuprofen 800 mg tablet every 8 hours as needed for pain and inflammation secondary to viral illness. -Continue to monitor symptoms for any change in severity, if any escalation of symptoms follow-up for further evaluation and management in the ER.

## 2023-08-07 ENCOUNTER — Ambulatory Visit (HOSPITAL_COMMUNITY)

## 2023-08-07 ENCOUNTER — Ambulatory Visit (HOSPITAL_COMMUNITY)
Admission: EM | Admit: 2023-08-07 | Discharge: 2023-08-07 | Disposition: A | Discharge status: Home / Self Care | Attending: Family Medicine | Admitting: Family Medicine

## 2023-08-07 ENCOUNTER — Encounter (HOSPITAL_COMMUNITY): Payer: Self-pay

## 2023-08-07 DIAGNOSIS — M25562 Pain in left knee: Secondary | ICD-10-CM | POA: Diagnosis not present

## 2023-08-07 DIAGNOSIS — S8392XA Sprain of unspecified site of left knee, initial encounter: Secondary | ICD-10-CM | POA: Diagnosis not present

## 2023-08-07 MED ORDER — HYDROCODONE-ACETAMINOPHEN 5-325 MG PO TABS: 1.0000 | ORAL_TABLET | Freq: Four times a day (QID) | ORAL | 0 refills | Status: AC | PRN

## 2023-08-07 MED ORDER — DICLOFENAC SODIUM 75 MG PO TBEC: 75.0000 mg | DELAYED_RELEASE_TABLET | Freq: Two times a day (BID) | ORAL | 0 refills | Status: AC

## 2023-08-07 NOTE — ED Triage Notes (Signed)
 Patient presenting with left knee pain and swelling onset last night after he jumped wrong. States he woke up this morning in worse pain.   Prescriptions or OTC medications tried: Yes- ice pack    with no relief

## 2023-08-07 NOTE — ED Provider Notes (Signed)
 [facility]   [id number] 08/07/23 Arrival Time: 1109  ASSESSMENT & PLAN:  1. Acute pain of left knee   2. Sprain of left knee, unspecified ligament, initial encounter    Ques patellar tendon involvement. Denies trauma.  Crutches for non-weight bearing for the next few days.  Discharge Medication List as of 08/07/2023 12:40 PM     START taking these medications   Details  diclofenac  (VOLTAREN ) 75 MG EC tablet Take 1 tablet (75 mg total) by mouth 2 (two) times daily., Starting Thu 08/07/2023, Normal    HYDROcodone -acetaminophen  (NORCO/VICODIN) 5-325 MG tablet Take 1 tablet by mouth every 6 (six) hours as needed for moderate pain (pain score 4-6) or severe pain (pain score 7-10)., Starting Thu 08/07/2023, Normal        Orders Placed This Encounter  Procedures   DG Knee Complete 4 Views Left   Crutches   Work/school excuse note: provided. Recommend:  Follow-up Information     Schedule an appointment as soon as possible for a visit  with [facility].   Contact information: [address] [place]  [address] [phone]                [place] Controlled Substances Registry consulted for this patient. I feel the risk/benefit ratio today is favorable for proceeding with this prescription for a controlled substance. Medication sedation precautions given.  Reviewed expectations re: course of current medical issues. Questions answered. Outlined signs and symptoms indicating need for more acute intervention. Patient verbalized understanding. After Visit Summary given.  SUBJECTIVE: History from: patient. Angel Wheeler is a 28 y.o. male who reports LEFT knee pain; noted this morning upon waking; was in and out of truck a lot yesterday and questions relation. Denies knee trauma. Denies extremity sensation changes or weakness. No tx PTA.  History reviewed. No pertinent surgical history.    OBJECTIVE:  Vitals:    08/07/23 1217 08/07/23 1218  BP:  131/87  Pulse:  75  Resp:  18  Temp:  98.7 F (37.1 C)  TempSrc:  Oral  SpO2:  94%  Weight: 111.1 kg   Height: 5' 8 (1.727 m)     General appearance: alert; no distress HEENT: [place]; AT Neck: supple with FROM Resp: unlabored respirations Extremities: LLE: warm with well perfused appearance; poorly localized marked tenderness over left anterior inferior knee; without gross deformities; swelling: none; bruising: none; knee ROM: normal, with discomfort CV: brisk extremity capillary refill of LLE; 2+ DP pulse of LLE. Skin: warm and dry; no visible rashes Neurologic: normal sensation and strength of LLE Psychological: alert and cooperative; normal mood and affect  Imaging: No results found.    No Known Allergies  History reviewed. No pertinent past medical history. Social History   Socioeconomic History   Marital status: Single    Spouse name: Not on file   Number of children: Not on file   Years of education: Not on file   Highest education level: Not on file  Occupational History   Not on file  Tobacco Use   Smoking status: Never   Smokeless tobacco: Never  Vaping Use   Vaping status: Never Used  Substance and Sexual Activity   Alcohol use: Yes   Drug use: Not Currently   Sexual activity: Yes  Other Topics Concern   Not on file  Social History Narrative   Not on file   Social Drivers of Health   Financial Resource Strain:  Not on file  Food Insecurity: Not on file  Transportation Needs: Not on file  Physical Activity: Not on file  Stress: Not on file  Social Connections: Not on file   Family History  Problem Relation Age of Onset   Hypertension Father    History reviewed. No pertinent surgical history.     Rolinda Rogue, MD 08/07/23 [phone]

## 2023-08-07 NOTE — Discharge Instructions (Signed)
 Be aware, you have been prescribed pain medications that may cause drowsiness. While taking this medication, do not take any other medications containing acetaminophen (Tylenol). Do not combine with alcohol or recreational drugs. Please do not drive, operate heavy machinery, or take part in activities that require making important decisions while on this medication as your judgement may be clouded.
